# Patient Record
Sex: Female | Born: 1990
Health system: Southern US, Community
[De-identification: ages and names within clinical notes are randomized; demographics above are authoritative.]

## PROBLEM LIST (undated history)

## (undated) ENCOUNTER — Inpatient Hospital Stay (HOSPITAL_COMMUNITY): Payer: Self-pay

## (undated) DIAGNOSIS — Z3400 Encounter for supervision of normal first pregnancy, unspecified trimester: Secondary | ICD-10-CM

## (undated) DIAGNOSIS — D649 Anemia, unspecified: Secondary | ICD-10-CM

## (undated) DIAGNOSIS — R7303 Prediabetes: Secondary | ICD-10-CM

## (undated) DIAGNOSIS — Z789 Other specified health status: Secondary | ICD-10-CM

## (undated) HISTORY — PX: NO PAST SURGERIES: SHX2092

## (undated) HISTORY — DX: Other specified health status: Z78.9

---
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
## 2010-12-15 ENCOUNTER — Emergency Department (HOSPITAL_COMMUNITY)
Admission: EM | Admit: 2010-12-15 | Discharge: 2010-12-15 | Payer: Self-pay | Source: Home / Self Care | Admitting: Emergency Medicine

## 2012-11-24 ENCOUNTER — Encounter (HOSPITAL_COMMUNITY): Payer: Self-pay

## 2012-11-24 ENCOUNTER — Inpatient Hospital Stay (HOSPITAL_COMMUNITY)
Admission: AD | Admit: 2012-11-24 | Discharge: 2012-11-24 | Disposition: A | Discharge status: Home / Self Care | Payer: Medicaid Other | Source: Ambulatory Visit | Attending: Obstetrics & Gynecology | Admitting: Obstetrics & Gynecology

## 2012-11-24 DIAGNOSIS — Z3490 Encounter for supervision of normal pregnancy, unspecified, unspecified trimester: Secondary | ICD-10-CM

## 2012-11-24 DIAGNOSIS — Z3201 Encounter for pregnancy test, result positive: Secondary | ICD-10-CM | POA: Insufficient documentation

## 2012-11-24 LAB — POCT PREGNANCY, URINE: Preg Test, Ur: POSITIVE — AB

## 2012-11-24 NOTE — MAU Note (Signed)
Had positive pregnancy and wanted to confirm pregnancy. No pain or bleeding.

## 2012-11-24 NOTE — MAU Provider Note (Signed)
  History     CSN: [id number]  Arrival date and time: 11/24/12 2126   None     Chief Complaint  Patient presents with  . Possible Pregnancy   HPI This is a 21 y.o. female who presents for verification of a positive pregnancy test she did at home. Very worried about telling her mother.   RN Note: Had positive pregnancy and wanted to confirm pregnancy. No pain or bleeding.   OB History    Grav Para Term Preterm Abortions TAB SAB Ect Mult Living   1               No past medical history on file.  No past surgical history on file.  No family history on file.  History  Substance Use Topics  . Smoking status: Not on file  . Smokeless tobacco: Not on file  . Alcohol Use: Not on file    Allergies: Allergies not on file  No prescriptions prior to admission    ROS No complaints  Physical Exam   Blood pressure 141/85, pulse 105, temperature 99 F (37.2 C), temperature source Oral, resp. rate 18, height 5\' 6"  (1.676 m), weight 141 lb 3.2 oz (64.048 kg), last menstrual period 10/08/2012.  Physical Exam  Constitutional: She is oriented to person, place, and time. She appears well-developed and well-nourished. No distress.  Cardiovascular: Normal rate.   Respiratory: Effort normal.  Neurological: She is alert and oriented to person, place, and time.  Skin: Skin is warm and dry.  Psychiatric: She has a normal mood and affect.  Pelvic exam deferred secondary to no complaints   MAU Course  Procedures  Assessment and Plan  A:  Pregnancy at [redacted]w[redacted]d        P:  Discharge home      Proof of pregnancy letter      Discussed communication issues      F/U with prenatal care  [facility] 11/24/2012, 10:25 PM

## 2012-12-04 NOTE — L&D Delivery Note (Signed)
Delivery Note At 7:53 PM a viable and healthy female was delivered via Vaginal, Spontaneous Delivery (Presentation: ; Occiput Anterior, to LOT).  APGAR: 5, ; weight 8 lb 11.9 oz (3966 g).   Placenta status: Intact, Spontaneous.  Cord: 3 vessels with the following complications: Nuchal.    Anesthesia: Epidural  Episiotomy: None Lacerations: Perineal;1st degree;Vaginal Suture Repair: 3.0 vicryl rapide Est. Blood Loss (mL): 400cc  Mom to postpartum.  Baby to stay with Mom.  BOVARD,Carmyn Hamm 07/16/2013, 8:26 PM  O+/RI/Contra POPs/Br  Pt desires circ for infant - including r/b/a

## 2013-01-02 ENCOUNTER — Ambulatory Visit (INDEPENDENT_AMBULATORY_CARE_PROVIDER_SITE_OTHER): Payer: Medicaid Other | Admitting: Obstetrics & Gynecology

## 2013-01-02 ENCOUNTER — Other Ambulatory Visit (HOSPITAL_COMMUNITY)
Admission: RE | Admit: 2013-01-02 | Discharge: 2013-01-02 | Disposition: A | Discharge status: Home / Self Care | Payer: Medicaid Other | Source: Ambulatory Visit | Attending: Obstetrics & Gynecology | Admitting: Obstetrics & Gynecology

## 2013-01-02 ENCOUNTER — Encounter: Payer: Self-pay | Admitting: Obstetrics & Gynecology

## 2013-01-02 VITALS — BP 110/70 | Temp 98.8°F | Ht 64.75 in | Wt 139.7 lb

## 2013-01-02 DIAGNOSIS — Z01419 Encounter for gynecological examination (general) (routine) without abnormal findings: Secondary | ICD-10-CM | POA: Insufficient documentation

## 2013-01-02 DIAGNOSIS — Z3480 Encounter for supervision of other normal pregnancy, unspecified trimester: Secondary | ICD-10-CM

## 2013-01-02 DIAGNOSIS — Z1389 Encounter for screening for other disorder: Secondary | ICD-10-CM

## 2013-01-02 DIAGNOSIS — Z113 Encounter for screening for infections with a predominantly sexual mode of transmission: Secondary | ICD-10-CM | POA: Insufficient documentation

## 2013-01-02 DIAGNOSIS — Z3687 Encounter for antenatal screening for uncertain dates: Secondary | ICD-10-CM

## 2013-01-02 DIAGNOSIS — Z3490 Encounter for supervision of normal pregnancy, unspecified, unspecified trimester: Secondary | ICD-10-CM | POA: Insufficient documentation

## 2013-01-02 LAB — POCT URINALYSIS DIP (DEVICE)
Bilirubin Urine: NEGATIVE
Glucose, UA: NEGATIVE mg/dL
Hgb urine dipstick: NEGATIVE
Nitrite: NEGATIVE
Urobilinogen, UA: 0.2 mg/dL (ref 0.0–1.0)
pH: 7 (ref 5.0–8.0)

## 2013-01-02 LAB — HIV ANTIBODY (ROUTINE TESTING W REFLEX): HIV: NONREACTIVE

## 2013-01-02 NOTE — Progress Notes (Signed)
   Subjective:    Terry Kennedy is a G1P0 [redacted]w[redacted]d being seen today for her first obstetrical visit.  Her obstetrical history is significant for none. Patient does intend to breast feed. Pregnancy history fully reviewed.  Patient reports fatigue and headache.  Headaches are 2-3x weeks.  Frontal.  Non vomiting. No photophobia.  Pt has not taken any meds.  Will start with Tylenol.  Relaxation techniques discussed.  Filed Vitals:   01/02/13 0828  BP: 110/70  Temp: 98.8 F (37.1 C)  Weight: 139 lb 11.2 oz (63.368 kg)    HISTORY: OB History    Grav Para Term Preterm Abortions TAB SAB Ect Mult Living   1              # Outc Date GA Lbr Len/2nd Wgt Sex Del Anes PTL Lv   1 CUR              Past Medical History  Diagnosis Date  . No pertinent past medical history    Past Surgical History  Procedure Date  . No past surgeries    History reviewed. No pertinent family history.   Exam    Uterus:     Pelvic Exam:    Perineum: No Hemorrhoids   Vulva: normal   Vagina:  normal mucosa, normal discharge   pH: n/a   Cervix: no bleeding following Pap   Adnexa: normal adnexa   Bony Pelvis: average  System: Breast:  pt refused   Skin: normal coloration and turgor, no rashes    Neurologic: oriented, normal mood   Extremities: no erythema, induration, or nodules   HEENT oropharynx clear, no lesions   Mouth/Teeth mucous membranes moist, pharynx normal without lesions   Neck supple and no masses   Cardiovascular: regular rate and rhythm   Respiratory:  chest clear, no wheezing, crepitations, rhonchi, normal symmetric air entry   Abdomen: normal findings: uterus about 13 weeks   Urinary: urethral meatus normal      Assessment:    Pregnancy: G1P0 Patient Active Problem List  Diagnosis  . Unsure of last menstrual period, need [place] for dates        Plan:     Initial labs drawn. Prenatal vitamins. Problem list reviewed and updated. Genetic Screening discussed First Screen:  ordered.  Ultrasound discussed; fetal survey: requested.  Follow up in 4 weeks. 50% of 30 min visit spent on counseling and coordination of care.  Tylenol for headaches   Terry Kennedy H. 01/02/2013

## 2013-01-02 NOTE — Addendum Note (Signed)
Addended by: Soyla Murphy T on: 01/02/2013 12:09 PM   Modules accepted: Orders

## 2013-01-02 NOTE — Progress Notes (Signed)
Nutrition note: 1st visit consult Pt has gained 9.7# @ [redacted]w[redacted]d, which is > expected. Pt reports eating 3 meals & 2-3 snacks/d. Pt reports drinking water & juice daily and milk most days. Pt reports taking a Women's One-A-Day multivitamin. Pt reports no N&V or heartburn. NKFA Pt received verbal & written education on general nutrition during pregnancy & Mercury in Fish handout. Encouraged pt to start taking a PNV or 2 chewable multivitamins to ensure adequate intake of vitamins (her current multivit is low in folic acid & vit A).  Disc wt gain goals of 25-35# or 1#/wk.  Pt agrees to start taking PNV or equivalent. Pt has WIC & plans to BF. F/u if referred Terry Reveal, MS, RD, LDN

## 2013-01-02 NOTE — Progress Notes (Signed)
First Screen and dating U/S scheduled 01/07/13 at 415pm in [facility].

## 2013-01-02 NOTE — Addendum Note (Signed)
Addended by: Franchot Mimes on: 01/02/2013 02:45 PM   Modules accepted: Orders

## 2013-01-02 NOTE — Patient Instructions (Signed)
Pregnancy - First Trimester During sexual intercourse, millions of sperm go into the vagina. Only 1 sperm will penetrate and fertilize the female egg while it is in the Fallopian tube. One week later, the fertilized egg implants into the wall of the uterus. An embryo begins to develop into a baby. At 6 to 8 weeks, the eyes and face are formed and the heartbeat can be seen on ultrasound. At the end of 12 weeks (first trimester), all the baby's organs are formed. Now that you are pregnant, you will want to do everything you can to have a healthy baby. Two of the most important things are to get good prenatal care and follow your caregiver's instructions. Prenatal care is all the medical care you receive before the baby's birth. It is given to prevent, find, and treat problems during the pregnancy and childbirth. PRENATAL EXAMS  During prenatal visits, your weight, blood pressure and urine are checked. This is done to make sure you are healthy and progressing normally during the pregnancy.  A pregnant woman should gain 25 to 35 pounds during the pregnancy. However, if you are over weight or underweight, your caregiver will advise you regarding your weight.  Your caregiver will ask and answer questions for you.  Blood work, cervical cultures, other necessary tests and a Pap test are done during your prenatal exams. These tests are done to check on your health and the probable health of your baby. Tests are strongly recommended and done for HIV with your permission. This is the virus that causes AIDS. These tests are done because medications can be given to help prevent your baby from being born with this infection should you have been infected without knowing it. Blood work is also used to find out your blood type, previous infections and follow your blood levels (hemoglobin).  Low hemoglobin (anemia) is common during pregnancy. Iron and vitamins are given to help prevent this. Later in the pregnancy, blood  tests for diabetes will be done along with any other tests if any problems develop. You may need tests to make sure you and the baby are doing well.  You may need other tests to make sure you and the baby are doing well. CHANGES DURING THE FIRST TRIMESTER (THE FIRST 3 MONTHS OF PREGNANCY) Your body goes through many changes during pregnancy. They vary from person to person. Talk to your caregiver about changes you notice and are concerned about. Changes can include:  Your menstrual period stops.  The egg and sperm carry the genes that determine what you look like. Genes from you and your partner are forming a baby. The female genes determine whether the baby is a boy or a girl.  Your body increases in girth and you may feel bloated.  Feeling sick to your stomach (nauseous) and throwing up (vomiting). If the vomiting is uncontrollable, call your caregiver.  Your breasts will begin to enlarge and become tender.  Your nipples may stick out more and become darker.  The need to urinate more. Painful urination may mean you have a bladder infection.  Tiring easily.  Loss of appetite.  Cravings for certain kinds of food.  At first, you may gain or lose a couple of pounds.  You may have changes in your emotions from day to day (excited to be pregnant or concerned something may go wrong with the pregnancy and baby).  You may have more vivid and strange dreams. HOME CARE INSTRUCTIONS   It is very important   to avoid all smoking, alcohol and un-prescribed drugs during your pregnancy. These affect the formation and growth of the baby. Avoid chemicals while pregnant to ensure the delivery of a healthy infant.  Start your prenatal visits by the 12th week of pregnancy. They are usually scheduled monthly at first, then more often in the last 2 months before delivery. Keep your caregiver's appointments. Follow your caregiver's instructions regarding medication use, blood and lab tests, exercise, and  diet.  During pregnancy, you are providing food for you and your baby. Eat regular, well-balanced meals. Choose foods such as meat, fish, milk and other low fat dairy products, vegetables, fruits, and whole-grain breads and cereals. Your caregiver will tell you of the ideal weight gain.  You can help morning sickness by keeping soda crackers at the bedside. Eat a couple before arising in the morning. You may want to use the crackers without salt on them.  Eating 4 to 5 small meals rather than 3 large meals a day also may help the nausea and vomiting.  Drinking liquids between meals instead of during meals also seems to help nausea and vomiting.  A physical sexual relationship may be continued throughout pregnancy if there are no other problems. Problems may be early (premature) leaking of amniotic fluid from the membranes, vaginal bleeding, or belly (abdominal) pain.  Exercise regularly if there are no restrictions. Check with your caregiver or physical therapist if you are unsure of the safety of some of your exercises. Greater weight gain will occur in the last 2 trimesters of pregnancy. Exercising will help:  Control your weight.  Keep you in shape.  Prepare you for labor and delivery.  Help you lose your pregnancy weight after you deliver your baby.  Wear a good support or jogging bra for breast tenderness during pregnancy. This may help if worn during sleep too.  Ask when prenatal classes are available. Begin classes when they are offered.  Do not use hot tubs, steam rooms or saunas.  Wear your seat belt when driving. This protects you and your baby if you are in an accident.  Avoid raw meat, uncooked cheese, cat litter boxes and soil used by cats throughout the pregnancy. These carry germs that can cause birth defects in the baby.  The first trimester is a good time to visit your dentist for your dental health. Getting your teeth cleaned is OK. Use a softer toothbrush and brush  gently during pregnancy.  Ask for help if you have financial, counseling or nutritional needs during pregnancy. Your caregiver will be able to offer counseling for these needs as well as refer you for other special needs.  Do not take any medications or herbs unless told by your caregiver.  Inform your caregiver if there is any mental or physical domestic violence.  Make a list of emergency phone numbers of family, friends, hospital, and police and fire departments.  Write down your questions. Take them to your prenatal visit.  Do not douche.  Do not cross your legs.  If you have to stand for long periods of time, rotate you feet or take small steps in a circle.  You may have more vaginal secretions that may require a sanitary pad. Do not use tampons or scented sanitary pads. MEDICATIONS AND DRUG USE IN PREGNANCY  Take prenatal vitamins as directed. The vitamin should contain 1 milligram of folic acid. Keep all vitamins out of reach of children. Only a couple vitamins or tablets containing iron may be   fatal to a baby or young child when ingested.  Avoid use of all medications, including herbs, over-the-counter medications, not prescribed or suggested by your caregiver. Only take over-the-counter or prescription medicines for pain, discomfort, or fever as directed by your caregiver. Do not use aspirin, ibuprofen, or naproxen unless directed by your caregiver.  Let your caregiver also know about herbs you may be using.  Alcohol is related to a number of birth defects. This includes fetal alcohol syndrome. All alcohol, in any form, should be avoided completely. Smoking will cause low birth rate and premature babies.  Street or illegal drugs are very harmful to the baby. They are absolutely forbidden. A baby born to an addicted mother will be addicted at birth. The baby will go through the same withdrawal an adult does.  Let your caregiver know about any medications that you have to take  and for what reason you take them. MISCARRIAGE IS COMMON DURING PREGNANCY A miscarriage does not mean you did something wrong. It is not a reason to worry about getting pregnant again. Your caregiver will help you with questions you may have. If you have a miscarriage, you may need minor surgery. SEEK MEDICAL CARE IF:  You have any concerns or worries during your pregnancy. It is better to call with your questions if you feel they cannot wait, rather than worry about them. SEEK IMMEDIATE MEDICAL CARE IF:   An unexplained oral temperature above 102 F (38.9 C) develops, or as your caregiver suggests.  You have leaking of fluid from the vagina (birth canal). If leaking membranes are suspected, take your temperature and inform your caregiver of this when you call.  There is vaginal spotting or bleeding. Notify your caregiver of the amount and how many pads are used.  You develop a bad smelling vaginal discharge with a change in the color.  You continue to feel sick to your stomach (nauseated) and have no relief from remedies suggested. You vomit blood or coffee ground-like materials.  You lose more than 2 pounds of weight in 1 week.  You gain more than 2 pounds of weight in 1 week and you notice swelling of your face, hands, feet, or legs.  You gain 5 pounds or more in 1 week (even if you do not have swelling of your hands, face, legs, or feet).  You get exposed to [place] measles and have never had them.  You are exposed to fifth disease or chickenpox.  You develop belly (abdominal) pain. Round ligament discomfort is a common non-cancerous (benign) cause of abdominal pain in pregnancy. Your caregiver still must evaluate this.  You develop headache, fever, diarrhea, pain with urination, or shortness of breath.  You fall or are in a car accident or have any kind of trauma.  There is mental or physical violence in your home. Document Released: 11/14/2001 Document Revised: 02/12/2012  Document Reviewed: 05/18/2009 [employer] Patient Information 2013 [employer].  

## 2013-01-02 NOTE — Progress Notes (Signed)
Pulse: 96 Needs to see nutrition and social work. Needs new ob labs. Undecided on flu shot.  No 100% sure of lmp, states that she knows it was in November.

## 2013-01-02 NOTE — Addendum Note (Signed)
Addended by: Franchot Mimes on: 01/02/2013 02:40 PM   Modules accepted: Orders

## 2013-01-03 LAB — OBSTETRIC PANEL
Eosinophils Relative: 1 % (ref 0–5)
HCT: 35.3 % — ABNORMAL LOW (ref 36.0–46.0)
Hemoglobin: 12.4 g/dL (ref 12.0–15.0)
Hepatitis B Surface Ag: NEGATIVE
Lymphocytes Relative: 29 % (ref 12–46)
MCH: 30.5 pg (ref 26.0–34.0)
MCHC: 35.1 g/dL (ref 30.0–36.0)
MCV: 86.9 fL (ref 78.0–100.0)
Neutrophils Relative %: 64 % (ref 43–77)
Platelets: 212 10*3/uL (ref 150–400)
RDW: 14 % (ref 11.5–15.5)
Rubella: 1.53 Index — ABNORMAL HIGH (ref <0.90)

## 2013-01-06 LAB — HEMOGLOBINOPATHY EVALUATION
Hemoglobin Other: 0 %
Hgb A2 Quant: 2.8 % (ref 2.2–3.2)
Hgb F Quant: 0.3 % (ref 0.0–2.0)
Hgb S Quant: 0 %

## 2013-01-07 ENCOUNTER — Encounter: Payer: Self-pay | Admitting: Obstetrics & Gynecology

## 2013-01-07 ENCOUNTER — Ambulatory Visit (HOSPITAL_COMMUNITY)
Admission: RE | Admit: 2013-01-07 | Discharge: 2013-01-07 | Disposition: A | Discharge status: Home / Self Care | Payer: Medicaid Other | Source: Ambulatory Visit | Attending: Obstetrics & Gynecology | Admitting: Obstetrics & Gynecology

## 2013-01-07 ENCOUNTER — Other Ambulatory Visit: Payer: Self-pay | Admitting: Obstetrics & Gynecology

## 2013-01-07 DIAGNOSIS — Z3689 Encounter for other specified antenatal screening: Secondary | ICD-10-CM | POA: Insufficient documentation

## 2013-01-07 DIAGNOSIS — Z3490 Encounter for supervision of normal pregnancy, unspecified, unspecified trimester: Secondary | ICD-10-CM

## 2013-01-07 DIAGNOSIS — Z3687 Encounter for antenatal screening for uncertain dates: Secondary | ICD-10-CM

## 2013-01-07 NOTE — Progress Notes (Signed)
Terry Kennedy  was seen today for an ultrasound appointment.  See full report in [facility].  Impression: Single IUP at 13 0/7 weeks NT of 1.2 mm noted.  Nasal bone visualized. First trimester aneuploidy screen performed as noted above.    Recommendations: Please do not draw triple/quad screen, though patient should be offered MSAFP for neural tube defect screening.  Recommend ultrasound for fetal anatomy at approximately [redacted] weeks gestation.  Alpha Gula, MD

## 2013-01-30 ENCOUNTER — Ambulatory Visit (INDEPENDENT_AMBULATORY_CARE_PROVIDER_SITE_OTHER): Payer: Medicaid Other | Admitting: Family

## 2013-01-30 VITALS — BP 109/66 | Temp 97.0°F | Wt 145.7 lb

## 2013-01-30 DIAGNOSIS — Z3491 Encounter for supervision of normal pregnancy, unspecified, first trimester: Secondary | ICD-10-CM

## 2013-01-30 DIAGNOSIS — Z3480 Encounter for supervision of other normal pregnancy, unspecified trimester: Secondary | ICD-10-CM

## 2013-01-30 DIAGNOSIS — Z3687 Encounter for antenatal screening for uncertain dates: Secondary | ICD-10-CM

## 2013-01-30 DIAGNOSIS — Z1389 Encounter for screening for other disorder: Secondary | ICD-10-CM

## 2013-01-30 LAB — POCT URINALYSIS DIP (DEVICE)
Bilirubin Urine: NEGATIVE
Glucose, UA: NEGATIVE mg/dL
Nitrite: NEGATIVE
Specific Gravity, Urine: 1.02 (ref 1.005–1.030)
pH: 7.5 (ref 5.0–8.0)

## 2013-01-30 NOTE — Progress Notes (Signed)
Pulse: 85

## 2013-01-30 NOTE — Progress Notes (Signed)
U/S scheduled 02/13/13 at 2 pm.

## 2013-01-30 NOTE — Progress Notes (Signed)
Reviewed lab results; schedule anatomy ultrasound in two weeks; next appt in two weeks for AFP.

## 2013-02-13 ENCOUNTER — Ambulatory Visit (INDEPENDENT_AMBULATORY_CARE_PROVIDER_SITE_OTHER): Payer: Medicaid Other | Admitting: Advanced Practice Midwife

## 2013-02-13 ENCOUNTER — Encounter: Payer: Self-pay | Admitting: Advanced Practice Midwife

## 2013-02-13 ENCOUNTER — Ambulatory Visit (HOSPITAL_COMMUNITY)
Admission: RE | Admit: 2013-02-13 | Discharge: 2013-02-13 | Disposition: A | Discharge status: Home / Self Care | Payer: Medicaid Other | Source: Ambulatory Visit | Attending: Family | Admitting: Family

## 2013-02-13 ENCOUNTER — Encounter: Payer: Self-pay | Admitting: Obstetrics & Gynecology

## 2013-02-13 ENCOUNTER — Other Ambulatory Visit: Payer: Self-pay | Admitting: Obstetrics & Gynecology

## 2013-02-13 VITALS — BP 108/69 | Temp 97.7°F | Wt 145.5 lb

## 2013-02-13 DIAGNOSIS — Z1389 Encounter for screening for other disorder: Secondary | ICD-10-CM | POA: Insufficient documentation

## 2013-02-13 DIAGNOSIS — Z363 Encounter for antenatal screening for malformations: Secondary | ICD-10-CM | POA: Insufficient documentation

## 2013-02-13 DIAGNOSIS — Z3491 Encounter for supervision of normal pregnancy, unspecified, first trimester: Secondary | ICD-10-CM

## 2013-02-13 DIAGNOSIS — Z3687 Encounter for antenatal screening for uncertain dates: Secondary | ICD-10-CM

## 2013-02-13 DIAGNOSIS — O358XX0 Maternal care for other (suspected) fetal abnormality and damage, not applicable or unspecified: Secondary | ICD-10-CM | POA: Insufficient documentation

## 2013-02-13 LAB — POCT URINALYSIS DIP (DEVICE)
Glucose, UA: NEGATIVE mg/dL
Hgb urine dipstick: NEGATIVE
Ketones, ur: NEGATIVE mg/dL
Protein, ur: NEGATIVE mg/dL
Specific Gravity, Urine: 1.02 (ref 1.005–1.030)
Urobilinogen, UA: 0.2 mg/dL (ref 0.0–1.0)
pH: 7.5 (ref 5.0–8.0)

## 2013-02-13 NOTE — Patient Instructions (Signed)
Pregnancy - Second Trimester The second trimester is the period between 13 to 27 weeks of your pregnancy. It is important to follow your doctor's instructions. HOME CARE   Do not smoke.  Do not drink alcohol or use drugs.  Only take medicine as told by your doctor.  Take prenatal vitamins as told. The vitamin should contain 1 milligram of folic acid.  Exercise.  Eat healthy foods. Eat regular, well-balanced meals.  You can have sex (intercourse) if there are no other problems with the pregnancy.  Do not use hot tubs, steam rooms, or saunas.  Wear a seat belt while driving.  Avoid raw meat, uncooked cheese, and litter boxes and soil used by cats.  Visit your dentist. Shirlee [place]ick are okay. GET HELP RIGHT AWAY IF:   You have a temperature by mouth above 102 F (38.9 C), not controlled by medicine.  Fluid is coming from your vagina.  Blood is coming from your vagina. Light spotting is common, especially after sex (intercourse).  You have a bad smelling fluid (discharge) coming from the vagina. The fluid changes from clear to white.  You still feel sick to your stomach (nauseous).  You throw up (vomit) blood.  You lose or gain more than 2 pounds (0.9 kilograms) of weight in a week, or as suggested by your doctor.  Your face, hands, feet, or legs get puffy (swell).  You get exposed to [place] measles and have never had them.  You get exposed to fifth disease or chickenpox.  You have belly (abdominal) pain.  You have a bad headache that will not go away.  You have watery poop (diarrhea), pain when you pee (urinate), or have shortness of breath.  You start to have problems seeing (blurry or double vision).  You fall, are in a car accident, or have any kind of trauma.  There is mental or physical violence at home.  You have any concerns or worries during your pregnancy. MAKE SURE YOU:   Understand these instructions.  Will watch your condition.  Will get help  right away if you are not doing well or get worse. Document Released: 02/14/2010 Document Revised: 02/12/2012 Document Reviewed: 02/14/2010 [facility] Patient Information 2013 [place], [place].

## 2013-02-13 NOTE — Progress Notes (Signed)
Well, no c/o. AFP today. Anatomy u/s this afternoon. Rev'd precautions, f/u in 4 weeks.

## 2013-02-13 NOTE — Progress Notes (Signed)
Pulse- 88 Needs a note indicating the need to allow her to wear more comfortable shoes at work

## 2013-02-15 ENCOUNTER — Encounter: Payer: Self-pay | Admitting: Family

## 2013-02-26 ENCOUNTER — Encounter: Payer: Self-pay | Admitting: *Deleted

## 2013-03-13 ENCOUNTER — Ambulatory Visit (INDEPENDENT_AMBULATORY_CARE_PROVIDER_SITE_OTHER): Payer: Medicaid Other | Admitting: Obstetrics & Gynecology

## 2013-03-13 VITALS — BP 105/65 | Temp 97.6°F | Wt 152.4 lb

## 2013-03-13 DIAGNOSIS — Z3480 Encounter for supervision of other normal pregnancy, unspecified trimester: Secondary | ICD-10-CM

## 2013-03-13 DIAGNOSIS — Z3492 Encounter for supervision of normal pregnancy, unspecified, second trimester: Secondary | ICD-10-CM

## 2013-03-13 LAB — POCT URINALYSIS DIP (DEVICE)
Bilirubin Urine: NEGATIVE
Hgb urine dipstick: NEGATIVE
Nitrite: NEGATIVE
Protein, ur: NEGATIVE mg/dL
Specific Gravity, Urine: 1.015 (ref 1.005–1.030)

## 2013-03-13 NOTE — Patient Instructions (Signed)
Return to clinic for any obstetric concerns or go to MAU for evaluation  

## 2013-03-13 NOTE — Progress Notes (Signed)
Normal anatomy. No other complaints or concerns.  Obstetric precautions reviewed.

## 2013-03-13 NOTE — Progress Notes (Signed)
Pulse: 86

## 2013-04-10 ENCOUNTER — Encounter: Payer: Medicaid Other | Admitting: Obstetrics and Gynecology

## 2013-06-17 LAB — OB RESULTS CONSOLE GC/CHLAMYDIA: Gonorrhea: NEGATIVE

## 2013-06-19 LAB — OB RESULTS CONSOLE GBS: GBS: NEGATIVE

## 2013-07-15 ENCOUNTER — Encounter (HOSPITAL_COMMUNITY): Payer: Self-pay

## 2013-07-15 ENCOUNTER — Inpatient Hospital Stay (HOSPITAL_COMMUNITY)
Admission: RE | Admit: 2013-07-15 | Discharge: 2013-07-18 | DRG: 775 | Disposition: A | Discharge status: Home / Self Care | Payer: Medicaid Other | Source: Ambulatory Visit | Attending: Obstetrics and Gynecology | Admitting: Obstetrics and Gynecology

## 2013-07-15 ENCOUNTER — Inpatient Hospital Stay (HOSPITAL_COMMUNITY)
Admission: AD | Admit: 2013-07-15 | Payer: Medicaid Other | Source: Ambulatory Visit | Admitting: Obstetrics and Gynecology

## 2013-07-15 DIAGNOSIS — Z3400 Encounter for supervision of normal first pregnancy, unspecified trimester: Secondary | ICD-10-CM

## 2013-07-15 DIAGNOSIS — O480 Post-term pregnancy: Principal | ICD-10-CM | POA: Diagnosis present

## 2013-07-15 HISTORY — DX: Encounter for supervision of normal first pregnancy, unspecified trimester: Z34.00

## 2013-07-15 LAB — CBC
HCT: 34 % — ABNORMAL LOW (ref 36.0–46.0)
Hemoglobin: 11.8 g/dL — ABNORMAL LOW (ref 12.0–15.0)
MCH: 30.5 pg (ref 26.0–34.0)
MCHC: 34.7 g/dL (ref 30.0–36.0)
MCV: 87.9 fL (ref 78.0–100.0)
RBC: 3.87 MIL/uL (ref 3.87–5.11)

## 2013-07-15 MED ORDER — BUTORPHANOL TARTRATE 1 MG/ML IJ SOLN
2.0000 mg | INTRAMUSCULAR | Status: DC | PRN
  Filled 2013-07-15: qty 1

## 2013-07-15 MED ORDER — IBUPROFEN 600 MG PO TABS: 600.0000 mg | ORAL_TABLET | Freq: Four times a day (QID) | ORAL | Status: DC | PRN

## 2013-07-15 MED ORDER — OXYTOCIN 40 UNITS IN LACTATED RINGERS INFUSION - SIMPLE MED: 62.5000 mL/h | INTRAVENOUS | Status: DC

## 2013-07-15 MED ORDER — OXYTOCIN BOLUS FROM INFUSION: 500.0000 mL | INTRAVENOUS | Status: DC

## 2013-07-15 MED ORDER — LACTATED RINGERS IV SOLN
INTRAVENOUS | Status: DC
  Administered 2013-07-15 – 2013-07-16 (×2): via INTRAVENOUS
  Administered 2013-07-16: 125 mL/h via INTRAVENOUS

## 2013-07-15 MED ORDER — ZOLPIDEM TARTRATE 5 MG PO TABS
5.0000 mg | ORAL_TABLET | Freq: Every evening | ORAL | Status: DC | PRN
  Filled 2013-07-15: qty 1

## 2013-07-15 MED ORDER — ONDANSETRON HCL 4 MG/2ML IJ SOLN: 4.0000 mg | Freq: Four times a day (QID) | INTRAMUSCULAR | Status: DC | PRN

## 2013-07-15 MED ORDER — ACETAMINOPHEN 325 MG PO TABS: 650.0000 mg | ORAL_TABLET | ORAL | Status: DC | PRN

## 2013-07-15 MED ORDER — LIDOCAINE HCL (PF) 1 % IJ SOLN
30.0000 mL | INTRAMUSCULAR | Status: DC | PRN
  Filled 2013-07-15: qty 30

## 2013-07-15 MED ORDER — CITRIC ACID-SODIUM CITRATE 334-500 MG/5ML PO SOLN: 30.0000 mL | ORAL | Status: DC | PRN

## 2013-07-15 MED ORDER — TERBUTALINE SULFATE 1 MG/ML IJ SOLN: 0.2500 mg | Freq: Once | INTRAMUSCULAR | Status: AC | PRN

## 2013-07-15 MED ORDER — LACTATED RINGERS IV SOLN
500.0000 mL | INTRAVENOUS | Status: DC | PRN
  Administered 2013-07-16: 500 mL via INTRAVENOUS

## 2013-07-15 MED ORDER — MISOPROSTOL 25 MCG QUARTER TABLET
25.0000 ug | ORAL_TABLET | ORAL | Status: DC | PRN
  Administered 2013-07-15: 25 ug via VAGINAL
  Filled 2013-07-15: qty 0.25
  Filled 2013-07-15: qty 1

## 2013-07-15 MED ORDER — OXYCODONE-ACETAMINOPHEN 5-325 MG PO TABS
1.0000 | ORAL_TABLET | ORAL | Status: DC | PRN
Start: 2013-07-15 — End: 2013-07-16

## 2013-07-15 MED ORDER — OXYTOCIN 40 UNITS IN LACTATED RINGERS INFUSION - SIMPLE MED
1.0000 m[IU]/min | INTRAVENOUS | Status: DC
  Administered 2013-07-16: 2 m[IU]/min via INTRAVENOUS
  Administered 2013-07-16: 1 m[IU]/min via INTRAVENOUS
  Filled 2013-07-15: qty 1000

## 2013-07-15 NOTE — H&P (Signed)
NATTALIE SANTIESTEBAN is a 22 y.o. female G1P0 at 6+ for iolgiven postdates status.  +FM, no LOF, no VB, occ ctx; Uncomplicated prenatal care except transfer of care from [facility] clinics at 24 weeks.  gbbs negative.   Maternal Medical History:  Contractions: Frequency: irregular.    Fetal activity: Perceived fetal activity is normal.      OB History   Grav Para Term Preterm Abortions TAB SAB Ect Mult Living   1             G1 present No abn pap, h/o Chl  Past Medical History  Diagnosis Date  . No pertinent past medical history   . Normal pregnancy, first 07/15/2013   Past Surgical History  Procedure Laterality Date  . No past surgeries     Family History: DM Social History:  reports that she has never smoked. She has never used smokeless tobacco. She reports that she does not drink alcohol or use illicit drugs.single  Meds PNV All NKDA   Prenatal Transfer Tool  Maternal Diabetes: No Genetic Screening: Declined Maternal Ultrasounds/Referrals: Normal Fetal Ultrasounds or other Referrals:  None Maternal Substance Abuse:  No Significant Maternal Medications:  None Significant Maternal Lab Results:  Lab values include: Group B Strep negative Other Comments:  None  Review of Systems  Constitutional: Negative.   HENT: Negative.   Eyes: Negative.   Respiratory: Negative.   Cardiovascular: Negative.   Gastrointestinal: Negative.   Genitourinary: Negative.   Skin: Negative.   Neurological: Negative.   Psychiatric/Behavioral: Negative.     Dilation: 1 Exam by:: Dr. Jackelyn Knife Blood pressure 121/74, pulse 97, temperature 98.6 F (37 C), temperature source Oral, resp. rate 20, height 5\' 5"  (1.651 m), weight 78.019 kg (172 lb), last menstrual period 10/08/2012. Maternal Exam:  Uterine Assessment: Contraction frequency is irregular.   Abdomen: Fundal height is appropriate for gestation.   Estimated fetal weight is 7.5-8.5#.   Fetal presentation: vertex  Introitus: Normal  vulva. Normal vagina.  Pelvis: adequate for delivery.   Cervix: Cervix evaluated by digital exam.     Physical Exam  Constitutional: She is oriented to person, place, and time. She appears well-developed and well-nourished.  HENT:  Head: Normocephalic and atraumatic.  Cardiovascular: Normal rate and regular rhythm.   Respiratory: Effort normal and breath sounds normal. No respiratory distress.  GI: Soft. Bowel sounds are normal. There is no tenderness.  Musculoskeletal: Normal range of motion.  Neurological: She is alert and oriented to person, place, and time.  Skin: Skin is warm and dry.  Psychiatric: She has a normal mood and affect. Her behavior is normal.    Prenatal labs: ABO, Rh: O/POS/-- (01/30 1446) Antibody: NEG (01/30 1446) Rubella: 1.53 (01/30 1446) RPR: NON REAC (01/30 1446)  HBsAg: NEGATIVE (01/30 1446)  HIV: NON REACTIVE (01/30 1446)  GBS:   neg  glucoa 90/ GC neg/ Chl neg/nl Hgb electro/Hgb 12.4/Plt 212K/  [place] EDC 07/15/13 nl anat, ant plac, female  Assessment/Plan: 22yo G1P0 at 69+ for IOL given term status iol by cervical ripening, then pitocin' Expect SVD Epidural and stadol prn   BOVARD,Woodard Perrell 07/15/2013, 10:09 PM

## 2013-07-15 NOTE — Progress Notes (Signed)
22yo, G1P0, EGA [redacted] weeks, admitted by Dr. Ellyn Hack for ripening and induction. Feeling some ctx Afeb, VSS FHT- Cat I, ctx q 2-4 min VE-1/30/-3, vtx, cytotec placed Will monitor progress

## 2013-07-16 ENCOUNTER — Inpatient Hospital Stay (HOSPITAL_COMMUNITY): Payer: Medicaid Other | Admitting: Anesthesiology

## 2013-07-16 ENCOUNTER — Encounter (HOSPITAL_COMMUNITY): Payer: Self-pay | Admitting: Anesthesiology

## 2013-07-16 ENCOUNTER — Encounter (HOSPITAL_COMMUNITY): Payer: Self-pay

## 2013-07-16 MED ORDER — LIDOCAINE HCL (PF) 1 % IJ SOLN
INTRAMUSCULAR | Status: DC | PRN
  Administered 2013-07-16 (×2): 4 mL

## 2013-07-16 MED ORDER — TETANUS-DIPHTH-ACELL PERTUSSIS 5-2.5-18.5 LF-MCG/0.5 IM SUSP
0.5000 mL | Freq: Once | INTRAMUSCULAR | Status: AC
  Administered 2013-07-17: 0.5 mL via INTRAMUSCULAR
  Filled 2013-07-16: qty 0.5

## 2013-07-16 MED ORDER — DIPHENHYDRAMINE HCL 25 MG PO CAPS: 25.0000 mg | ORAL_CAPSULE | Freq: Four times a day (QID) | ORAL | Status: DC | PRN

## 2013-07-16 MED ORDER — OXYCODONE-ACETAMINOPHEN 5-325 MG PO TABS: 1.0000 | ORAL_TABLET | ORAL | Status: DC | PRN

## 2013-07-16 MED ORDER — PRENATAL MULTIVITAMIN CH
1.0000 | ORAL_TABLET | Freq: Every day | ORAL | Status: DC
  Administered 2013-07-17 – 2013-07-18 (×2): 1 via ORAL
  Filled 2013-07-16 (×2): qty 1

## 2013-07-16 MED ORDER — FENTANYL 2.5 MCG/ML BUPIVACAINE 1/10 % EPIDURAL INFUSION (WH - ANES)
INTRAMUSCULAR | Status: DC | PRN
  Administered 2013-07-16: 14 mL/h via EPIDURAL

## 2013-07-16 MED ORDER — LANOLIN HYDROUS EX OINT: TOPICAL_OINTMENT | CUTANEOUS | Status: DC | PRN

## 2013-07-16 MED ORDER — PHENYLEPHRINE 40 MCG/ML (10ML) SYRINGE FOR IV PUSH (FOR BLOOD PRESSURE SUPPORT)
80.0000 ug | PREFILLED_SYRINGE | INTRAVENOUS | Status: DC | PRN
  Filled 2013-07-16: qty 2
  Filled 2013-07-16: qty 5

## 2013-07-16 MED ORDER — LACTATED RINGERS IV SOLN: INTRAVENOUS | Status: DC

## 2013-07-16 MED ORDER — SENNOSIDES-DOCUSATE SODIUM 8.6-50 MG PO TABS
2.0000 | ORAL_TABLET | Freq: Every day | ORAL | Status: DC
  Administered 2013-07-16 – 2013-07-17 (×2): 2 via ORAL

## 2013-07-16 MED ORDER — CITRIC ACID-SODIUM CITRATE 334-500 MG/5ML PO SOLN
ORAL | Status: AC
  Filled 2013-07-16: qty 15

## 2013-07-16 MED ORDER — PHENYLEPHRINE 40 MCG/ML (10ML) SYRINGE FOR IV PUSH (FOR BLOOD PRESSURE SUPPORT)
80.0000 ug | PREFILLED_SYRINGE | INTRAVENOUS | Status: DC | PRN
  Filled 2013-07-16: qty 2

## 2013-07-16 MED ORDER — ONDANSETRON HCL 4 MG PO TABS: 4.0000 mg | ORAL_TABLET | ORAL | Status: DC | PRN

## 2013-07-16 MED ORDER — LACTATED RINGERS IV SOLN: 500.0000 mL | Freq: Once | INTRAVENOUS | Status: DC

## 2013-07-16 MED ORDER — DIBUCAINE 1 % RE OINT: 1.0000 "application " | TOPICAL_OINTMENT | RECTAL | Status: DC | PRN

## 2013-07-16 MED ORDER — IBUPROFEN 600 MG PO TABS
600.0000 mg | ORAL_TABLET | Freq: Four times a day (QID) | ORAL | Status: DC
  Administered 2013-07-16 – 2013-07-18 (×7): 600 mg via ORAL
  Filled 2013-07-16 (×8): qty 1

## 2013-07-16 MED ORDER — ZOLPIDEM TARTRATE 5 MG PO TABS: 5.0000 mg | ORAL_TABLET | Freq: Every evening | ORAL | Status: DC | PRN

## 2013-07-16 MED ORDER — EPHEDRINE 5 MG/ML INJ
10.0000 mg | INTRAVENOUS | Status: DC | PRN
  Filled 2013-07-16: qty 2

## 2013-07-16 MED ORDER — WITCH HAZEL-GLYCERIN EX PADS
1.0000 "application " | MEDICATED_PAD | CUTANEOUS | Status: DC | PRN
  Administered 2013-07-17: 1 via TOPICAL

## 2013-07-16 MED ORDER — EPHEDRINE 5 MG/ML INJ
10.0000 mg | INTRAVENOUS | Status: DC | PRN
  Filled 2013-07-16: qty 2
  Filled 2013-07-16: qty 4

## 2013-07-16 MED ORDER — ONDANSETRON HCL 4 MG/2ML IJ SOLN: 4.0000 mg | INTRAMUSCULAR | Status: DC | PRN

## 2013-07-16 MED ORDER — DIPHENHYDRAMINE HCL 50 MG/ML IJ SOLN: 12.5000 mg | INTRAMUSCULAR | Status: DC | PRN

## 2013-07-16 MED ORDER — FENTANYL 2.5 MCG/ML BUPIVACAINE 1/10 % EPIDURAL INFUSION (WH - ANES)
14.0000 mL/h | INTRAMUSCULAR | Status: DC | PRN
  Administered 2013-07-16: 14 mL/h via EPIDURAL
  Filled 2013-07-16 (×2): qty 125

## 2013-07-16 MED ORDER — SIMETHICONE 80 MG PO CHEW: 80.0000 mg | CHEWABLE_TABLET | ORAL | Status: DC | PRN

## 2013-07-16 MED ORDER — BENZOCAINE-MENTHOL 20-0.5 % EX AERO
1.0000 "application " | INHALATION_SPRAY | CUTANEOUS | Status: DC | PRN
  Administered 2013-07-17: 1 via TOPICAL
  Filled 2013-07-16 (×2): qty 56

## 2013-07-16 NOTE — Anesthesia Preprocedure Evaluation (Signed)

## 2013-07-16 NOTE — Progress Notes (Signed)
Pt transferred to room 142 via wheelchair, newborn in mothers arms. Family at pts side.

## 2013-07-16 NOTE — Progress Notes (Signed)
Patient ID: Terry Kennedy, female   DOB: 1991/01/03, 22 y.o.   MRN: [id number]  D/w pt POC, voices understanding Comfortable with epidural  130's mod var Toco, tripling, q 1-45min  Continue IOL

## 2013-07-16 NOTE — Anesthesia Procedure Notes (Signed)
Epidural Patient location during procedure: OB Start time: 07/16/2013 7:00 AM  Staffing Anesthesiologist: Muneeb Veras A. Performed by: anesthesiologist   Preanesthetic Checklist Completed: patient identified, site marked, surgical consent, pre-op evaluation, timeout performed, IV checked, risks and benefits discussed and monitors and equipment checked  Epidural Patient position: sitting Prep: site prepped and draped and DuraPrep Patient monitoring: continuous pulse ox and blood pressure Approach: midline Injection technique: LOR air  Needle:  Needle type: Tuohy  Needle gauge: 17 G Needle length: 9 cm and 9 Needle insertion depth: 5 cm cm Catheter type: closed end flexible Catheter size: 19 Gauge Catheter at skin depth: 10 cm Test dose: negative and Other  Assessment Events: blood not aspirated, injection not painful, no injection resistance, negative IV test and no paresthesia  Additional Notes Patient identified. Risks and benefits discussed including failed block, incomplete  Pain control, post dural puncture headache, nerve damage, paralysis, blood pressure Changes, nausea, vomiting, reactions to medications-both toxic and allergic and post Partum back pain. All questions were answered. Patient expressed understanding and wished to proceed. Sterile technique was used throughout procedure. Epidural site was Dressed with sterile barrier dressing. No paresthesias, signs of intravascular injection Or signs of intrathecal spread were encountered.  Patient was more comfortable after the epidural was dosed. Please see RN's note for documentation of vital signs and FHR which are stable.

## 2013-07-16 NOTE — Progress Notes (Signed)
Dr. Ellyn Hack notified of prolonged/late variables, position changed side to side, Iv fluid bolus, pitocin stopped, SVE.

## 2013-07-16 NOTE — Progress Notes (Signed)
Patient ID: Terry Kennedy, female   DOB: 04/03/1991, 22 y.o.   MRN: [id number]  Doing well.  Comfortable with epidural.  AF VSS gen NAD  FHTs - 140's mod variability toco Q , tripling  SVE 5/80/0  IUPC placed w/o diff/comp  Continue IOL

## 2013-07-16 NOTE — Progress Notes (Signed)
Patient ID: Terry Kennedy, female   DOB: [birth year], 22 y.o.   MRN: [id number]  Comfortable with epidural  AF VSS gen NAD  FHTs 140's, mod var toco q 2-4  9/100/0-+1  Expect SVD

## 2013-07-16 NOTE — Progress Notes (Signed)
Patient ID: Terry Kennedy, female   DOB: 06/14/1991, 22 y.o.   MRN: [id number]  Called to review strip at 15:10 Pt with variables/slowing heart rate Pt with rapid cervical change and tetanic ctx. Pitocin off Recovery to 130-140, mod var Close monitoring

## 2013-07-17 ENCOUNTER — Encounter (HOSPITAL_COMMUNITY): Payer: Self-pay

## 2013-07-17 LAB — CBC
Hemoglobin: 10.5 g/dL — ABNORMAL LOW (ref 12.0–15.0)
MCH: 31 pg (ref 26.0–34.0)
MCV: 87.9 fL (ref 78.0–100.0)
RBC: 3.39 MIL/uL — ABNORMAL LOW (ref 3.87–5.11)

## 2013-07-17 NOTE — Anesthesia Postprocedure Evaluation (Signed)
  Anesthesia Post-op Note  Patient: Terry Kennedy  Procedure(s) Performed: * No procedures listed *  Patient Location: PACU and Mother/Baby  Anesthesia Type:Epidural  Level of Consciousness: awake, alert  and oriented  Airway and Oxygen Therapy: Patient Spontanous Breathing  Post-op Pain: none  Post-op Assessment: Post-op Vital signs reviewed, Patient's Cardiovascular Status Stable, No headache, No backache, No residual numbness and No residual motor weakness  Post-op Vital Signs: Reviewed and stable  Complications: No apparent anesthesia complications

## 2013-07-17 NOTE — Progress Notes (Signed)
UR chart review completed.  

## 2013-07-17 NOTE — Progress Notes (Signed)
Patient called me into room because she felt a lot of fluid come out-pt admits to voiding in the bed on her pads. Was told when assisted to bathroom earlier to always empty bladder and encouraged to empty bladder before going back to bed. Patient scared because it hurt too bad

## 2013-07-17 NOTE — Progress Notes (Signed)
Post Partum Day 1 Subjective: no complaints, up ad lib, voiding, tolerating PO and nl lochia, pain controlled  Objective: Blood pressure 108/62, pulse 85, temperature 98.3 F (36.8 C), temperature source Oral, resp. rate 16, height 5\' 5"  (1.651 m), weight 78.019 kg (172 lb), last menstrual period 10/08/2012, SpO2 100.00%, unknown if currently breastfeeding.  Physical Exam:  General: alert and no distress Lochia: appropriate Uterine Fundus: firm  Recent Labs  07/15/13 2015 07/17/13 0609  HGB 11.8* 10.5*  HCT 34.0* 29.8*    Assessment/Plan: Plan for discharge tomorrow, Breastfeeding and Lactation consult. Routine pp care.     LOS: 2 days   BOVARD,Amsi Grimley 07/17/2013, 8:18 AM

## 2013-07-18 MED ORDER — IBUPROFEN 800 MG PO TABS: 800.0000 mg | ORAL_TABLET | Freq: Three times a day (TID) | ORAL | Status: DC | PRN

## 2013-07-18 MED ORDER — OXYCODONE-ACETAMINOPHEN 5-325 MG PO TABS: 1.0000 | ORAL_TABLET | Freq: Four times a day (QID) | ORAL | Status: DC | PRN

## 2013-07-18 MED ORDER — PRENATAL MULTIVITAMIN CH: 1.0000 | ORAL_TABLET | Freq: Every day | ORAL | Status: DC

## 2013-07-18 NOTE — Progress Notes (Signed)
Post Partum Day 2 Subjective: no complaints, up ad lib, voiding, tolerating PO and nl lochia, pain controlled.    Objective: Blood pressure 107/66, pulse 81, temperature 98.7 F (37.1 C), temperature source Oral, resp. rate 18, height 5\' 5"  (1.651 m), weight 78.019 kg (172 lb), last menstrual period 10/08/2012, SpO2 100.00%, unknown if currently breastfeeding.  Physical Exam:  General: alert and no distress Lochia: appropriate Uterine Fundus: firm   Recent Labs  07/15/13 2015 07/17/13 0609  HGB 11.8* 10.5*  HCT 34.0* 29.8*    Assessment/Plan: Discharge home, Breastfeeding and Lactation consult.   Routine pp care.  D/c with motrin/percocet/pnv.  F/u 6 weeks.     LOS: 3 days   BOVARD,Massa Pe 07/18/2013, 7:43 AM

## 2013-07-18 NOTE — Lactation Note (Signed)
This note was copied from the chart of Terry Lynnox Girten. Lactation Consultation Note  Patient Name: Terry Kennedy ZOXWR'U Date: 07/18/2013   Pecola Leisure has exclusively breast fed, breast are filling and colostrum can be hand expressed. Reviewed hand expression and discussed alternate breast massage during the feeding to increase with transfer. Baby has not stooled yet. Mother asked to call with the next feeding so LC can assist if needed. Mother states baby cluster fed last night. Mother has a discharge and she will stay with baby if he is made the patient.  Maternal Data    Feeding Feeding Type: Breast Milk Length of feed: 15 min (15)  LATCH Score/Interventions                      Lactation Tools Discussed/Used     Consult Status      Christella Hartigan M 07/18/2013, 11:22 AM

## 2013-07-18 NOTE — Discharge Summary (Signed)
Obstetric Discharge Summary Reason for Admission: induction of labor Prenatal Procedures: none Intrapartum Procedures: spontaneous vaginal delivery Postpartum Procedures: none Complications-Operative and Postpartum: 2nd degree perineal laceration Hemoglobin  Date Value Range Status  07/17/2013 10.5* 12.0 - 15.0 g/dL Final     HCT  Date Value Range Status  07/17/2013 29.8* 36.0 - 46.0 % Final    Physical Exam:  General: alert and no distress Lochia: appropriate Uterine Fundus: firm  Discharge Diagnoses: Term Pregnancy-delivered  Discharge Information: Date: 07/18/2013 Activity: pelvic rest Diet: routine Medications: PNV, Ibuprofen and Percocet Condition: stable Instructions: refer to practice specific booklet Discharge to: home Follow-up Information   Follow up with BOVARD,Naksh Radi, MD. Schedule an appointment as soon as possible for a visit in 6 weeks.   Specialty:  Obstetrics and Gynecology   Contact information:   [address] [place] [address] [phone]       Newborn Data: Live born female  Birth Weight: 8 lb 11.9 oz (3966 g) APGAR: 5, 8  Home with mother.  BOVARD,Collier Bohnet 07/18/2013, 8:51 AM

## 2013-07-19 ENCOUNTER — Ambulatory Visit: Payer: Self-pay

## 2013-07-19 NOTE — Lactation Note (Signed)
This note was copied from the chart of Terry Kennedy. Lactation Consultation Note: Follow up visit with mom. She has just finished feeding the baby on the left breast for 30 minutes. Baby not interested in nursing on the right breast. Going off to sleep. Mom's breasts are getting pretty full. Reports that left breast feels softer since the baby has nursed on the side. Going to pump on right. She plans to call [facility] about a pump on Monday. Ice pack filled for mom. Baby still has not stooled since delivery. No breast feeding questions at this time. To call prn  Patient Name: Terry Kennedy ZOXWR'U Date: 07/19/2013 Reason for consult: Follow-up assessment   Maternal Data    Feeding Feeding Type: Breast Milk  LATCH Score/Interventions Latch: Too sleepy or reluctant, no latch achieved, no sucking elicited.  Audible Swallowing: None Intervention(s): Skin to skin  Type of Nipple: Everted at rest and after stimulation Intervention(s): Hand pump  Comfort (Breast/Nipple): Soft / non-tender  Problem noted: Filling Interventions  (Cracked/bleeding/bruising/blister): Hand pump;Expressed breast milk to nipple  Hold (Positioning): No assistance needed to correctly position infant at breast. Intervention(s): Breastfeeding basics reviewed  LATCH Score: 6  Lactation Tools Discussed/Used     Consult Status Consult Status: PRN    Pamelia Hoit 07/19/2013, 9:27 AM

## 2014-02-08 ENCOUNTER — Encounter: Payer: Self-pay | Admitting: Family

## 2014-10-05 ENCOUNTER — Encounter (HOSPITAL_COMMUNITY): Payer: Self-pay

## 2015-08-13 ENCOUNTER — Emergency Department (INDEPENDENT_AMBULATORY_CARE_PROVIDER_SITE_OTHER)
Admission: EM | Admit: 2015-08-13 | Discharge: 2015-08-13 | Disposition: A | Discharge status: Home / Self Care | Payer: Self-pay | Source: Home / Self Care | Attending: Family Medicine | Admitting: Family Medicine

## 2015-08-13 ENCOUNTER — Encounter (HOSPITAL_COMMUNITY): Payer: Self-pay | Admitting: Emergency Medicine

## 2015-08-13 ENCOUNTER — Other Ambulatory Visit (HOSPITAL_COMMUNITY)
Admission: RE | Admit: 2015-08-13 | Discharge: 2015-08-13 | Disposition: A | Discharge status: Home / Self Care | Payer: Medicaid Other | Source: Ambulatory Visit | Attending: Family Medicine | Admitting: Family Medicine

## 2015-08-13 DIAGNOSIS — N760 Acute vaginitis: Secondary | ICD-10-CM | POA: Insufficient documentation

## 2015-08-13 DIAGNOSIS — I889 Nonspecific lymphadenitis, unspecified: Secondary | ICD-10-CM

## 2015-08-13 DIAGNOSIS — N898 Other specified noninflammatory disorders of vagina: Secondary | ICD-10-CM

## 2015-08-13 NOTE — ED Notes (Signed)
Pt reports she feels a mass on left side of pelvic onset 1 week; denies pain Concerned b/c of family hx of fibroid tumors Denies urinary sx and having regular BM Alert... No acute distress.

## 2015-08-13 NOTE — Discharge Instructions (Signed)
The bumps in your groin area are from reactive lymph nodes. These likely became reactive from shaving in that area and the folliculitis which is infection of hair follicles. This came from probably using an older razor. Please make sure to change her razor regularly and use antibacterial soap. He may also need to stop shaving for a period of time. We will call you if the vaginal swab shows signs of infection requiring indications to cure.

## 2015-08-13 NOTE — ED Provider Notes (Signed)
CSN: [id number]     Arrival date & time 08/13/15  1345 History   First MD Initiated Contact with Patient 08/13/15 1436     Chief Complaint  Patient presents with  . Cyst   (Consider location/radiation/quality/duration/timing/severity/associated sxs/prior Treatment) HPI  Rash in pelvic region. Started 1 week ago then resolved after using antibiotic ointment. . Developed bumps  Under the skin in the area a couple days later.  Constant. Getting bigger  Sexually active. Condoms every time.   Vaginal discharge: thick and white. No vaginal irritations. Denies recent ABX use.   Last intercourse 3 wks ago.   Denies any nausea, vomiting, fevers, night sweats, skin lesions currently, chest pain, shortness breath, palpitations.     Past Medical History  Diagnosis Date  . No pertinent past medical history   . Normal pregnancy, first 07/15/2013  . SVD (spontaneous vaginal delivery) 07/16/2013   Past Surgical History  Procedure Laterality Date  . No past surgeries     Family History  Problem Relation Age of Onset  . Fibroids Other    Social History  Substance Use Topics  . Smoking status: Never Smoker   . Smokeless tobacco: Never Used  . Alcohol Use: No   OB History    Gravida Para Term Preterm AB TAB SAB Ectopic Multiple Living   Review of Systems Per HPI with all other pertinent systems negative.   Allergies  Review of patient's allergies indicates no known allergies.  Home Medications   Prior to Admission medications   Not on File   Meds Ordered and Administered this Visit  Medications - No data to display  BP 122/71 mmHg  Pulse 84  Temp(Src) 97.5 F (36.4 C) (Oral)  Resp 16  SpO2 99%  Breastfeeding? No No data found.   Physical Exam Physical Exam  Constitutional: oriented to person, place, and time. appears well-developed and well-nourished. No distress.  HENT:  Head: Normocephalic and atraumatic.  Eyes: EOMI. PERRL.  Neck: Normal  range of motion.  Cardiovascular: RRR, no m/r/g, 2+ distal pulses,  Pulmonary/Chest: Effort normal and breath sounds normal. No respiratory distress.  Abdominal: Soft. Bowel sounds are normal. NonTTP, no distension.  Musculoskeletal: Normal range of motion. Non ttp, no effusion.  Neurological: alert and oriented to person, place, and time.  Skin: Skin of the pubic region shaved, multiple palpable lymph nodes felt just underneath the surface of the skin.Marland Kitchen  Psychiatric: normal mood and affect. behavior is normal. Judgment and thought content normal.  GU: Labia normal, no acute right discharge present. ED Course  Procedures (including critical care time)  Labs Review Labs Reviewed  CYTOLOGY, (ORAL, ANAL, URETHRAL) ANCILLARY ONLY    Imaging Review No results found.   Visual Acuity Review  Right Eye Distance:   Left Eye Distance:   Bilateral Distance:    Right Eye Near:   Left Eye Near:    Bilateral Near:         MDM   1. Lymphadenitis   2. Vaginal discharge    Patient likely had foot colitis which is now resolved. This led to swell lymph nodes in the groin area. Reassurance provided. Unlikely to be related to STD. Will send wet prep for further evaluation of vaginal discharge and will treat accordingly. Difficult to tell whether she has BV or yeast but suspect one of these. Counseled patient on changing her razor more frequently, not shaving at all,  use of antibacterial soap.    Ozella Rocks, MD 08/13/15 [phone]

## 2015-08-16 LAB — CERVICOVAGINAL ANCILLARY ONLY: WET PREP (BD AFFIRM): POSITIVE — AB

## 2015-08-17 NOTE — ED Notes (Addendum)
Final lab report for STD check available for review. Positive for gardnerella. DR Randal Buba authorized Flagyl 500 mg, BID x 7 days. Called to discuss reports w patient, left message for patient to call us. Patient returned call at 7:10 pm, and at her request I have called her Rx to [facility], [address] Spoke directly w pharmacist

## 2015-08-19 NOTE — ED Notes (Addendum)
Patient called and had questions about her medications, and her physical findings on her recent visit. States shse has not yet gotten her medication. Was advised to pick up her rx from the pharmacy ASAP, and to start it , and take as directed

## 2015-10-29 ENCOUNTER — Emergency Department (HOSPITAL_COMMUNITY)
Admission: EM | Admit: 2015-10-29 | Discharge: 2015-10-29 | Discharge status: Against Medical Advice | Payer: Medicaid Other | Attending: Emergency Medicine | Admitting: Emergency Medicine

## 2015-10-29 ENCOUNTER — Emergency Department (HOSPITAL_COMMUNITY)
Admission: EM | Admit: 2015-10-29 | Discharge: 2015-10-30 | Disposition: A | Discharge status: Home / Self Care | Payer: Self-pay | Attending: Emergency Medicine | Admitting: Emergency Medicine

## 2015-10-29 ENCOUNTER — Encounter (HOSPITAL_COMMUNITY): Payer: Self-pay | Admitting: Emergency Medicine

## 2015-10-29 ENCOUNTER — Encounter (HOSPITAL_COMMUNITY): Payer: Self-pay

## 2015-10-29 DIAGNOSIS — R109 Unspecified abdominal pain: Secondary | ICD-10-CM

## 2015-10-29 DIAGNOSIS — N83202 Unspecified ovarian cyst, left side: Secondary | ICD-10-CM | POA: Insufficient documentation

## 2015-10-29 DIAGNOSIS — R1110 Vomiting, unspecified: Secondary | ICD-10-CM | POA: Insufficient documentation

## 2015-10-29 DIAGNOSIS — Z3202 Encounter for pregnancy test, result negative: Secondary | ICD-10-CM | POA: Insufficient documentation

## 2015-10-29 DIAGNOSIS — R102 Pelvic and perineal pain: Secondary | ICD-10-CM

## 2015-10-29 LAB — URINALYSIS, ROUTINE W REFLEX MICROSCOPIC
BILIRUBIN URINE: NEGATIVE
Glucose, UA: 100 mg/dL — AB
HGB URINE DIPSTICK: NEGATIVE
KETONES UR: 15 mg/dL — AB
Leukocytes, UA: NEGATIVE
NITRITE: NEGATIVE
Protein, ur: NEGATIVE mg/dL
Specific Gravity, Urine: 1.022 (ref 1.005–1.030)
pH: 7.5 (ref 5.0–8.0)

## 2015-10-29 LAB — COMPREHENSIVE METABOLIC PANEL
ALBUMIN: 4.4 g/dL (ref 3.5–5.0)
ALK PHOS: 40 U/L (ref 38–126)
ALT: 9 U/L — AB (ref 14–54)
ANION GAP: 6 (ref 5–15)
AST: 16 U/L (ref 15–41)
BILIRUBIN TOTAL: 0.4 mg/dL (ref 0.3–1.2)
BUN: 6 mg/dL (ref 6–20)
CALCIUM: 9.1 mg/dL (ref 8.9–10.3)
CO2: 24 mmol/L (ref 22–32)
CREATININE: 0.71 mg/dL (ref 0.44–1.00)
Chloride: 108 mmol/L (ref 101–111)
GFR calc Af Amer: >60 mL/min (ref >60)
GFR calc non Af Amer: >60 mL/min (ref >60)
GLUCOSE: 154 mg/dL — AB (ref 65–99)
Potassium: 3.6 mmol/L (ref 3.5–5.1)
Sodium: 138 mmol/L (ref 135–145)
TOTAL PROTEIN: 7.7 g/dL (ref 6.5–8.1)

## 2015-10-29 LAB — CBC
HCT: 37.7 % (ref 36.0–46.0)
Hemoglobin: 12.8 g/dL (ref 12.0–15.0)
MCH: 30 pg (ref 26.0–34.0)
MCHC: 34 g/dL (ref 30.0–36.0)
MCV: 88.5 fL (ref 78.0–100.0)
PLATELETS: 242 10*3/uL (ref 150–400)
RBC: 4.26 MIL/uL (ref 3.87–5.11)
RDW: 12.7 % (ref 11.5–15.5)
WBC: 8.1 10*3/uL (ref 4.0–10.5)

## 2015-10-29 LAB — LIPASE, BLOOD: Lipase: 22 U/L (ref 11–51)

## 2015-10-29 LAB — I-STAT BETA HCG BLOOD, ED (MC, WL, AP ONLY)

## 2015-10-29 NOTE — ED Notes (Signed)
Abdominal pain with n/v since 10 am.  No vaginal discharge.  No change in urination.  No fever

## 2015-10-29 NOTE — ED Notes (Signed)
Patient reports that she is having pain to her lower pelvic region. The patient reports that this has been transpiring since 9 am. The patient reports that she is also having some vomiting

## 2015-10-29 NOTE — ED Notes (Signed)
Pt not found in lobby x 2.   

## 2015-10-30 ENCOUNTER — Emergency Department (HOSPITAL_COMMUNITY): Payer: Medicaid Other

## 2015-10-30 ENCOUNTER — Emergency Department (HOSPITAL_COMMUNITY): Payer: Self-pay

## 2015-10-30 LAB — WET PREP, GENITAL
Clue Cells Wet Prep HPF POC: NONE SEEN
Sperm: NONE SEEN
TRICH WET PREP: NONE SEEN
YEAST WET PREP: NONE SEEN

## 2015-10-30 MED ORDER — KETOROLAC TROMETHAMINE 60 MG/2ML IM SOLN
60.0000 mg | Freq: Once | INTRAMUSCULAR | Status: AC
  Administered 2015-10-30: 60 mg via INTRAMUSCULAR
  Filled 2015-10-30: qty 2

## 2015-10-30 MED ORDER — TRAMADOL HCL 50 MG PO TABS: 50.0000 mg | ORAL_TABLET | Freq: Four times a day (QID) | ORAL | Status: DC | PRN

## 2015-10-30 MED ORDER — ONDANSETRON 8 MG PO TBDP
8.0000 mg | ORAL_TABLET | Freq: Once | ORAL | Status: AC
  Administered 2015-10-30: 8 mg via ORAL
  Filled 2015-10-30: qty 1

## 2015-10-30 MED ORDER — HYDROCODONE-ACETAMINOPHEN 5-325 MG PO TABS
2.0000 | ORAL_TABLET | Freq: Once | ORAL | Status: DC
  Filled 2015-10-30: qty 2

## 2015-10-30 MED ORDER — NAPROXEN 500 MG PO TABS: 500.0000 mg | ORAL_TABLET | Freq: Two times a day (BID) | ORAL | Status: DC

## 2015-10-30 MED ORDER — DICYCLOMINE HCL 10 MG/ML IM SOLN
20.0000 mg | Freq: Once | INTRAMUSCULAR | Status: AC
  Administered 2015-10-30: 20 mg via INTRAMUSCULAR
  Filled 2015-10-30: qty 2

## 2015-10-30 NOTE — ED Notes (Signed)
Pt states that she has no pain at this time and requests that her pain medication be held for now.  No acute distress.

## 2015-10-30 NOTE — Discharge Instructions (Signed)
Ovarian Cyst An ovarian cyst is a fluid-filled sac that forms on an ovary. The ovaries are small organs that produce eggs in women. Various types of cysts can form on the ovaries. Most are not cancerous. Many do not cause problems, and they often go away on their own. Some may cause symptoms and require treatment. Common types of ovarian cysts include:  Functional cysts--These cysts may occur every month during the menstrual cycle. This is normal. The cysts usually go away with the next menstrual cycle if the woman does not get pregnant. Usually, there are no symptoms with a functional cyst.  Endometrioma cysts--These cysts form from the tissue that lines the uterus. They are also called "chocolate cysts" because they become filled with blood that turns brown. This type of cyst can cause pain in the lower abdomen during intercourse and with your menstrual period.  Cystadenoma cysts--This type develops from the cells on the outside of the ovary. These cysts can get very big and cause lower abdomen pain and pain with intercourse. This type of cyst can twist on itself, cut off its blood supply, and cause severe pain. It can also easily rupture and cause a lot of pain.  Dermoid cysts--This type of cyst is sometimes found in both ovaries. These cysts may contain different kinds of body tissue, such as skin, teeth, hair, or cartilage. They usually do not cause symptoms unless they get very big.  Theca lutein cysts--These cysts occur when too much of a certain hormone (human chorionic gonadotropin) is produced and overstimulates the ovaries to produce an egg. This is most common after procedures used to assist with the conception of a baby (in vitro fertilization). CAUSES   Fertility drugs can cause a condition in which multiple large cysts are formed on the ovaries. This is called ovarian hyperstimulation syndrome.  A condition called polycystic ovary syndrome can cause hormonal imbalances that can lead to  nonfunctional ovarian cysts. SIGNS AND SYMPTOMS  Many ovarian cysts do not cause symptoms. If symptoms are present, they may include:  Pelvic pain or pressure.  Pain in the lower abdomen.  Pain during sexual intercourse.  Increasing girth (swelling) of the abdomen.  Abnormal menstrual periods.  Increasing pain with menstrual periods.  Stopping having menstrual periods without being pregnant. DIAGNOSIS  These cysts are commonly found during a routine or annual pelvic exam. Tests may be ordered to find out more about the cyst. These tests may include:  Ultrasound.  X-ray of the pelvis.  CT scan.  MRI.  Blood tests. TREATMENT  Many ovarian cysts go away on their own without treatment. Your health care provider may want to check your cyst regularly for 2-3 months to see if it changes. For women in menopause, it is particularly important to monitor a cyst closely because of the higher rate of ovarian cancer in menopausal women. When treatment is needed, it may include any of the following:  A procedure to drain the cyst (aspiration). This may be done using a long needle and ultrasound. It can also be done through a laparoscopic procedure. This involves using a thin, lighted tube with a tiny camera on the end (laparoscope) inserted through a small incision.  Surgery to remove the whole cyst. This may be done using laparoscopic surgery or an open surgery involving a larger incision in the lower abdomen.  Hormone treatment or birth control pills. These methods are sometimes used to help dissolve a cyst. HOME CARE INSTRUCTIONS   Only take over-the-counter   or prescription medicines as directed by your health care provider.  Follow up with your health care provider as directed.  Get regular pelvic exams and Pap tests. SEEK MEDICAL CARE IF:   Your periods are late, irregular, or painful, or they stop.  Your pelvic pain or abdominal pain does not go away.  Your abdomen becomes  larger or swollen.  You have pressure on your bladder or trouble emptying your bladder completely.  You have pain during sexual intercourse.  You have feelings of fullness, pressure, or discomfort in your stomach.  You lose weight for no apparent reason.  You feel generally ill.  You become constipated.  You lose your appetite.  You develop acne.  You have an increase in body and facial hair.  You are gaining weight, without changing your exercise and eating habits.  You think you are pregnant. SEEK IMMEDIATE MEDICAL CARE IF:   You have increasing abdominal pain.  You feel sick to your stomach (nauseous), and you throw up (vomit).  You develop a fever that comes on suddenly.  You have abdominal pain during a bowel movement.  Your menstrual periods become heavier than usual. MAKE SURE YOU:  Understand these instructions.  Will watch your condition.  Will get help right away if you are not doing well or get worse.   This information is not intended to replace advice given to you by your health care provider. Make sure you discuss any questions you have with your health care provider.   Document Released: 11/20/2005 Document Revised: 11/25/2013 Document Reviewed: 07/28/2013 Elsevier Interactive Patient Education 2016 [employer]  

## 2015-10-30 NOTE — ED Provider Notes (Signed)
CSN: [id number]     Arrival date & time 10/29/15  1924 History   First MD Initiated Contact with Patient 10/30/15 0053     Chief Complaint  Patient presents with  . Pelvic Pain     (Consider location/radiation/quality/duration/timing/severity/associated sxs/prior Treatment) HPI Comments: Patient is a 24 year old G1P1 female who presents to the emergency department for further evaluation of pelvic pain. Patient states that pain began at 8:30 in the morning and has been intermittent and sharp since onset. Pain is located primarily in her left lower quadrant/suprapubic abdomen. She states that "laying a certain way helps". She denies taking any medications for pain control. Patient has had nausea as well as 3 episodes of nonbloody, nonbilious emesis since her pain began. She denies any radiation of her pain and denies associated fever, vaginal bleeding, vaginal discharge, dysuria, and hematuria. Her last menstrual period was 2 weeks ago. She denies any history of abdominal surgeries.  The history is provided by the patient. No language interpreter was used.    Past Medical History  Diagnosis Date  . No pertinent past medical history   . Normal pregnancy, first 07/15/2013  . SVD (spontaneous vaginal delivery) 07/16/2013   Past Surgical History  Procedure Laterality Date  . No past surgeries     Family History  Problem Relation Age of Onset  . Fibroids Other    Social History  Substance Use Topics  . Smoking status: Never Smoker   . Smokeless tobacco: Never Used  . Alcohol Use: No   OB History    Gravida Para Term Preterm AB TAB SAB Ectopic Multiple Living   Review of Systems  Constitutional: Negative for fever.  Gastrointestinal: Positive for nausea, vomiting and abdominal pain.  Genitourinary: Positive for pelvic pain. Negative for dysuria, hematuria, vaginal bleeding and vaginal discharge.  All other systems reviewed and are negative.   Allergies   Review of patient's allergies indicates no known allergies.  Home Medications   Prior to Admission medications   Medication Sig Start Date End Date Taking? Authorizing Provider  acetaminophen (TYLENOL) 500 MG tablet Take 500 mg by mouth every 6 (six) hours as needed (for pain.).   Yes Historical Provider, MD  ibuprofen (ADVIL,MOTRIN) 200 MG tablet Take 800 mg by mouth every 6 (six) hours as needed (for pain.).   Yes Historical Provider, MD   BP 104/60 mmHg  Pulse 81  Temp(Src) 98.9 F (37.2 C) (Oral)  Resp 16  Ht  (1.651 m)  Wt 58.968 kg  BMI 21.63 kg/m2  SpO2 96%  LMP 10/15/2015   Physical Exam  Constitutional: She is oriented to person, place, and time. She appears well-developed and well-nourished. No distress.  Patient appears uncomfortable. She is very mobile in the bed. She is whimpering.  HENT:  Head: Normocephalic and atraumatic.  Eyes: Conjunctivae and EOM are normal. No scleral icterus.  Neck: Normal range of motion.  Cardiovascular: Normal rate, regular rhythm and intact distal pulses.   Pulmonary/Chest: Effort normal. No respiratory distress. She has no wheezes. She has no rales.  Lungs clear to auscultation bilaterally.  Abdominal: Soft. She exhibits no distension. There is tenderness. There is no rebound and no guarding.  Tenderness to the suprapubic abdomen. No peritoneal signs or guarding. Abdomen is soft. Bowel sounds normoactive.  Genitourinary: There is no rash, tenderness, lesion or injury on the right labia. There is no rash, tenderness, lesion or  injury on the left labia. Uterus is tender (mild). Cervix exhibits no motion tenderness and no friability. Right adnexum displays no tenderness. Left adnexum displays tenderness (mild). No bleeding in the vagina. No vaginal discharge found.  Musculoskeletal: Normal range of motion.  Neurological: She is alert and oriented to person, place, and time. She exhibits normal muscle tone. Coordination normal.  Skin:  Skin is warm and dry. No rash noted. She is not diaphoretic. No erythema. No pallor.  Psychiatric: She has a normal mood and affect. Her behavior is normal.  Nursing note and vitals reviewed.   ED Course  Procedures (including critical care time) Labs Review Labs Reviewed  WET PREP, GENITAL - Abnormal; Notable for the following:    WBC, Wet Prep HPF POC MANY (*)    All other components within normal limits  GC/CHLAMYDIA PROBE AMP ([place]) NOT AT [facility]   Results for orders placed or performed during the hospital encounter of 10/29/15  Lipase, blood  Result Value Ref Range   Lipase 22 11 - 51 U/L  Comprehensive metabolic panel  Result Value Ref Range   Sodium 138 135 - 145 mmol/L   Potassium 3.6 3.5 - 5.1 mmol/L   Chloride 108 101 - 111 mmol/L   CO2 24 22 - 32 mmol/L   Glucose, Bld 154 (H) 65 - 99 mg/dL   BUN 6 6 - 20 mg/dL   Creatinine, Ser 5.78 0.44 - 1.00 mg/dL   Calcium 9.1 8.9 - 46.9 mg/dL   Total Protein 7.7 6.5 - 8.1 g/dL   Albumin 4.4 3.5 - 5.0 g/dL   AST 16 15 - 41 U/L   ALT 9 (L) 14 - 54 U/L   Alkaline Phosphatase 40 38 - 126 U/L   Total Bilirubin 0.4 0.3 - 1.2 mg/dL   GFR calc non Af Amer >60 >60 mL/min   GFR calc Af Amer >60 >60 mL/min   Anion gap 6 5 - 15  CBC  Result Value Ref Range   WBC 8.1 4.0 - 10.5 K/uL   RBC 4.26 3.87 - 5.11 MIL/uL   Hemoglobin 12.8 12.0 - 15.0 g/dL   HCT 62.9 52.8 - 41.3 %   MCV 88.5 78.0 - 100.0 fL   MCH 30.0 26.0 - 34.0 pg   MCHC 34.0 30.0 - 36.0 g/dL   RDW 24.4 01.0 - 27.2 %   Platelets 242 150 - 400 K/uL  Urinalysis, Routine w reflex microscopic (not at [facility])  Result Value Ref Range   Color, Urine YELLOW YELLOW   APPearance CLEAR CLEAR   Specific Gravity, Urine 1.022 1.005 - 1.030   pH 7.5 5.0 - 8.0   Glucose, UA 100 (A) NEGATIVE mg/dL   Hgb urine dipstick NEGATIVE NEGATIVE   Bilirubin Urine NEGATIVE NEGATIVE   Ketones, ur 15 (A) NEGATIVE mg/dL   Protein, ur NEGATIVE NEGATIVE mg/dL   Nitrite NEGATIVE NEGATIVE    Leukocytes, UA NEGATIVE NEGATIVE  I-Stat beta hCG blood, ED ([facility], [facility], [facility] only)  Result Value Ref Range   I-stat hCG, quantitative <5.0 <5 mIU/mL   Comment 3           Imaging Review US Transvaginal Non-ob  10/30/2015  CLINICAL DATA:  Acute onset of pelvic and left lower quadrant abdominal pain. Initial encounter. EXAM: TRANSABDOMINAL AND TRANSVAGINAL ULTRASOUND OF PELVIS DOPPLER ULTRASOUND OF OVARIES TECHNIQUE: Both transabdominal and transvaginal ultrasound examinations of the pelvis were performed. Transabdominal technique was performed for global imaging of the pelvis including uterus, ovaries,  adnexal regions, and pelvic cul-de-sac. It was necessary to proceed with endovaginal exam following the transabdominal exam to visualize the ovaries in greater detail. Color and duplex Doppler ultrasound was utilized to evaluate blood flow to the ovaries. COMPARISON:  Pelvic ultrasound performed 02/13/2013 FINDINGS: Uterus Measurements: 7.3 x 4.4 x 4.9 cm. No fibroids or other mass visualized. Endometrium Thickness: 0.5 cm.  No focal abnormality visualized. Right ovary Measurements: 3.4 x 1.4 x 1.7 cm. Normal appearance/no adnexal mass. Left ovary Measurements: 10.0 x 5.9 x 5.8 cm. A 4.6 x 4.4 x 3.2 cm complex cystic structure demonstrates an apparent mural nodule. There is also a complex heterogeneous region of soft tissue echogenicity at the left ovary, measuring 3.7 x 3.7 x 3.6 cm, of uncertain significance. Pulsed Doppler evaluation of both ovaries demonstrates normal low-resistance arterial and venous waveforms. Other findings A moderate amount of free fluid is seen within the pelvic cul-de-sac. IMPRESSION: 1. 4.6 cm left adnexal complex cystic structure demonstrates an apparent mural nodule. Complex heterogeneous region of soft tissue echogenicity at the left ovary, measuring 3.7 cm, of uncertain significance. MRI of the pelvis would be helpful for further evaluation, when and as deemed clinically  appropriate. 2. Moderate amount of free fluid within the pelvic cul-de-sac. 3. No evidence for ovarian torsion. 4. Uterus unremarkable in appearance. Electronically Signed   By: Roanna Raider M.D.   On: 10/30/2015 05:04   US Pelvis Complete  10/30/2015  CLINICAL DATA:  Acute onset of pelvic and left lower quadrant abdominal pain. Initial encounter. EXAM: TRANSABDOMINAL AND TRANSVAGINAL ULTRASOUND OF PELVIS DOPPLER ULTRASOUND OF OVARIES TECHNIQUE: Both transabdominal and transvaginal ultrasound examinations of the pelvis were performed. Transabdominal technique was performed for global imaging of the pelvis including uterus, ovaries, adnexal regions, and pelvic cul-de-sac. It was necessary to proceed with endovaginal exam following the transabdominal exam to visualize the ovaries in greater detail. Color and duplex Doppler ultrasound was utilized to evaluate blood flow to the ovaries. COMPARISON:  Pelvic ultrasound performed 02/13/2013 FINDINGS: Uterus Measurements: 7.3 x 4.4 x 4.9 cm. No fibroids or other mass visualized. Endometrium Thickness: 0.5 cm.  No focal abnormality visualized. Right ovary Measurements: 3.4 x 1.4 x 1.7 cm. Normal appearance/no adnexal mass. Left ovary Measurements: 10.0 x 5.9 x 5.8 cm. A 4.6 x 4.4 x 3.2 cm complex cystic structure demonstrates an apparent mural nodule. There is also a complex heterogeneous region of soft tissue echogenicity at the left ovary, measuring 3.7 x 3.7 x 3.6 cm, of uncertain significance. Pulsed Doppler evaluation of both ovaries demonstrates normal low-resistance arterial and venous waveforms. Other findings A moderate amount of free fluid is seen within the pelvic cul-de-sac. IMPRESSION: 1. 4.6 cm left adnexal complex cystic structure demonstrates an apparent mural nodule. Complex heterogeneous region of soft tissue echogenicity at the left ovary, measuring 3.7 cm, of uncertain significance. MRI of the pelvis would be helpful for further evaluation, when and  as deemed clinically appropriate. 2. Moderate amount of free fluid within the pelvic cul-de-sac. 3. No evidence for ovarian torsion. 4. Uterus unremarkable in appearance. Electronically Signed   By: Roanna Raider M.D.   On: 10/30/2015 05:04   [place] Art/ven Flow Abd Pelv Doppler  10/30/2015  CLINICAL DATA:  Acute onset of pelvic and left lower quadrant abdominal pain. Initial encounter. EXAM: TRANSABDOMINAL AND TRANSVAGINAL ULTRASOUND OF PELVIS DOPPLER ULTRASOUND OF OVARIES TECHNIQUE: Both transabdominal and transvaginal ultrasound examinations of the pelvis were performed. Transabdominal technique was performed for global imaging of the pelvis including uterus,  ovaries, adnexal regions, and pelvic cul-de-sac. It was necessary to proceed with endovaginal exam following the transabdominal exam to visualize the ovaries in greater detail. Color and duplex Doppler ultrasound was utilized to evaluate blood flow to the ovaries. COMPARISON:  Pelvic ultrasound performed 02/13/2013 FINDINGS: Uterus Measurements: 7.3 x 4.4 x 4.9 cm. No fibroids or other mass visualized. Endometrium Thickness: 0.5 cm.  No focal abnormality visualized. Right ovary Measurements: 3.4 x 1.4 x 1.7 cm. Normal appearance/no adnexal mass. Left ovary Measurements: 10.0 x 5.9 x 5.8 cm. A 4.6 x 4.4 x 3.2 cm complex cystic structure demonstrates an apparent mural nodule. There is also a complex heterogeneous region of soft tissue echogenicity at the left ovary, measuring 3.7 x 3.7 x 3.6 cm, of uncertain significance. Pulsed Doppler evaluation of both ovaries demonstrates normal low-resistance arterial and venous waveforms. Other findings A moderate amount of free fluid is seen within the pelvic cul-de-sac. IMPRESSION: 1. 4.6 cm left adnexal complex cystic structure demonstrates an apparent mural nodule. Complex heterogeneous region of soft tissue echogenicity at the left ovary, measuring 3.7 cm, of uncertain significance. MRI of the pelvis would be  helpful for further evaluation, when and as deemed clinically appropriate. 2. Moderate amount of free fluid within the pelvic cul-de-sac. 3. No evidence for ovarian torsion. 4. Uterus unremarkable in appearance. Electronically Signed   By: Roanna Raider M.D.   On: 10/30/2015 05:04   Dg Abd 2 Views  10/30/2015  CLINICAL DATA:  Left lower quadrant abdominal pain since this morning. Nausea and vomiting. EXAM: ABDOMEN - 2 VIEW COMPARISON:  None. FINDINGS: Scattered gas and stool in the colon. No small or large bowel distention. No free intra-abdominal air. No abnormal air-fluid levels. No radiopaque stones. Visualized bones appear intact. IMPRESSION: Normal nonobstructive bowel gas pattern. Electronically Signed   By: Burman Nieves M.D.   On: 10/30/2015 02:34   I have personally reviewed and evaluated these images and lab results as part of my medical decision-making.   EKG Interpretation None       0530 - Spoke with Dr. Jackelyn Knife, on call for [facility]. Have verbally communicated results of pelvic ultrasound. Question initiation of antibiotics for PID coverage which Dr. Jackelyn Knife does not believe is indicated given lack of infectious symptoms. He recommends, instead, outpatient office follow up in the coming days. Patient to call the office to schedule an appointment. MDM   Final diagnoses:  Abdominal pain  Cyst of left ovary    24 year old female presents to the emergency department for further evaluation of left lower quadrant/suprapubic abdominal pain which began at 8:30 AM. Patient presented to the emergency department earlier today, but left prior to being seen. Her labs from this visit were reviewed which are noncontributory. Patient has no leukocytosis or fever. Her urine does not suggest infection and her urine pregnancy is negative. Patient with some mild left adnexal tenderness which prompted completion of a pelvic ultrasound. Ultrasound revealed a 4.6 cm left adnexal complex cystic  structure consistent with an apparent mural nodule.  Workup and ultrasound findings discussed with Dr. Jackelyn Knife of [facility]. He recommends outpatient follow-up for further evaluation of findings. Dr. Jackelyn Knife does not believe antibiotics are indicated as patient has had no infectious symptoms. She does have a GC/Chlamydia culture pending. Patient with adequate pain control after Toradol. Will discharge with course of NSAIDs for management of pain. Patient instructed to follow-up with her OB/GYN. She verbalizes comfort and understanding with this plan. Return precautions given at discharge. Patient  discharged in satisfactory condition with no unaddressed concerns; VSS.   Filed Vitals:   10/29/15 1945 10/30/15 0039 10/30/15 0448 10/30/15 0552  BP: 125/73 125/75 104/60 105/48  Pulse: 91 85 81 73  Temp: 98.4 F (36.9 C)  98.9 F (37.2 C)   TempSrc:   Oral   Resp: 18 14 16 14   Height: 5\' 5"  (1.651 m)     Weight: 58.968 kg     SpO2: 100% 99% 96% 100%     Antony MaduraKelly Austyn Seier, PA-C 10/31/15 0007  Derwood KaplanAnkit Nanavati, MD 10/31/15 [phone]

## 2015-11-01 LAB — GC/CHLAMYDIA PROBE AMP (~~LOC~~) NOT AT ARMC
CHLAMYDIA, DNA PROBE: NEGATIVE
NEISSERIA GONORRHEA: NEGATIVE

## 2017-10-28 ENCOUNTER — Other Ambulatory Visit: Payer: Self-pay

## 2017-10-28 ENCOUNTER — Encounter (HOSPITAL_COMMUNITY): Payer: Self-pay

## 2017-10-28 ENCOUNTER — Emergency Department (HOSPITAL_COMMUNITY): Payer: BLUE CROSS/BLUE SHIELD

## 2017-10-28 ENCOUNTER — Emergency Department (HOSPITAL_COMMUNITY)
Admission: EM | Admit: 2017-10-28 | Discharge: 2017-10-28 | Disposition: A | Discharge status: Home / Self Care | Payer: BLUE CROSS/BLUE SHIELD | Attending: Emergency Medicine | Admitting: Emergency Medicine

## 2017-10-28 DIAGNOSIS — J111 Influenza due to unidentified influenza virus with other respiratory manifestations: Secondary | ICD-10-CM

## 2017-10-28 DIAGNOSIS — R0981 Nasal congestion: Secondary | ICD-10-CM | POA: Diagnosis present

## 2017-10-28 DIAGNOSIS — Z79899 Other long term (current) drug therapy: Secondary | ICD-10-CM | POA: Insufficient documentation

## 2017-10-28 DIAGNOSIS — R05 Cough: Secondary | ICD-10-CM | POA: Diagnosis not present

## 2017-10-28 DIAGNOSIS — R69 Illness, unspecified: Secondary | ICD-10-CM

## 2017-10-28 DIAGNOSIS — J069 Acute upper respiratory infection, unspecified: Secondary | ICD-10-CM | POA: Diagnosis not present

## 2017-10-28 LAB — RAPID STREP SCREEN (MED CTR MEBANE ONLY): Streptococcus, Group A Screen (Direct): NEGATIVE

## 2017-10-28 MED ORDER — ACETAMINOPHEN 325 MG PO TABS
650.0000 mg | ORAL_TABLET | Freq: Once | ORAL | Status: AC | PRN
  Administered 2017-10-28: 650 mg via ORAL
  Filled 2017-10-28: qty 2

## 2017-10-28 MED ORDER — FLUTICASONE PROPIONATE 50 MCG/ACT NA SUSP: 1.0000 | Freq: Every day | NASAL | 2 refills | Status: DC

## 2017-10-28 MED ORDER — BENZONATATE 100 MG PO CAPS: 100.0000 mg | ORAL_CAPSULE | Freq: Three times a day (TID) | ORAL | 0 refills | Status: DC

## 2017-10-28 NOTE — ED Provider Notes (Signed)
[facility]-EMERGENCY DEPT Provider Note   CSN: [id number] Arrival date & time: 10/28/17  1530     History   Chief Complaint Chief Complaint  Patient presents with  . Nasal Congestion  . Fever  . Generalized Body Aches    HPI Terry Kennedy is a 26 y.o. female with no significant past medical history, who presents to ED for evaluation of 4-day history of nasal congestion, productive cough, fever, right ear stuffiness.  Symptoms began suddenly at night.  She has tried Mucinex and hot teas with mild relief in her symptoms.  She does report a T-max of 101.  She reports sick contacts at home during the holidays with similar symptoms.  Denies any chest pain, shortness of breath, hemoptysis, drainage from the ear, nausea, vomiting, abdominal pain.  HPI  Past Medical History:  Diagnosis Date  . No pertinent past medical history   . Normal pregnancy, first 07/15/2013  . SVD (spontaneous vaginal delivery) 07/16/2013    Patient Active Problem List   Diagnosis Date Noted  . Normal pregnancy, first 07/15/2013  . Supervision of low-risk pregnancy 01/02/2013    Past Surgical History:  Procedure Laterality Date  . NO PAST SURGERIES      OB History    Gravida Para Term Preterm AB Living   1 1 1     1    SAB TAB Ectopic Multiple Live Births           1       Home Medications    Prior to Admission medications   Medication Sig Start Date End Date Taking? Authorizing Provider  acetaminophen (TYLENOL) 500 MG tablet Take 500 mg by mouth every 6 (six) hours as needed (for pain.).    [provider]  benzonatate (TESSALON) 100 MG capsule Take 1 capsule (100 mg total) by mouth every 8 (eight) hours. 10/28/17   Audy Dauphine, PA-C  fluticasone (FLONASE) 50 MCG/ACT nasal spray Place 1 spray into both nostrils daily. 10/28/17   Zarriah Starkel, PA-C  ibuprofen (ADVIL,MOTRIN) 200 MG tablet Take 800 mg by mouth every 6 (six) hours as needed (for pain.).     [provider]  naproxen (NAPROSYN) 500 MG tablet Take 1 tablet (500 mg total) by mouth 2 (two) times daily. 10/30/15   Antony MaduraHumes, Kelly, PA-C  traMADol (ULTRAM) 50 MG tablet Take 1 tablet (50 mg total) by mouth every 6 (six) hours as needed for severe pain. 10/30/15   Antony MaduraHumes, Kelly, PA-C    Family History Family History  Problem Relation Age of Onset  . Fibroids Other     Social History Social History   Tobacco Use  . Smoking status: Never Smoker  . Smokeless tobacco: Never Used  Substance Use Topics  . Alcohol use: No  . Drug use: No     Allergies   Patient has no known allergies.   Review of Systems Review of Systems  Constitutional: Positive for appetite change, chills and fever.  HENT: Positive for congestion, ear pain, postnasal drip, rhinorrhea and sore throat. Negative for ear discharge, facial swelling, hearing loss, sneezing, trouble swallowing and voice change.   Eyes: Negative for pain, discharge, redness and itching.  Respiratory: Positive for cough. Negative for shortness of breath.   Cardiovascular: Negative for chest pain.  Gastrointestinal: Negative for abdominal pain, nausea and vomiting.  Skin: Negative for rash.  Neurological: Negative for dizziness, weakness, light-headedness and headaches.     Physical Exam Updated Vital Signs BP 121/78 (  BP Location: Left Arm)   Pulse (!) 117   Temp 98.7 F (37.1 C) (Oral)   Resp 20   LMP 10/28/2017   SpO2 99%   Physical Exam  Constitutional: She appears well-developed and well-nourished. No distress.  HENT:  Head: Normocephalic and atraumatic.  Right Ear: A middle ear effusion is present.  Left Ear: A middle ear effusion is present.  Nose: Mucosal edema present.  Mouth/Throat: Uvula is midline. Posterior oropharyngeal erythema present. No posterior oropharyngeal edema. No tonsillar exudate.  Patient does not appear to be in acute distress. No trismus or drooling present. No pooling of secretions.  Patient is tolerating secretions and is not in respiratory distress. No neck pain or tenderness to palpation of the neck. Full active and passive range of motion of the neck. No evidence of RPA or PTA.  Eyes: Conjunctivae and EOM are normal. Pupils are equal, round, and reactive to light. Right eye exhibits no discharge. Left eye exhibits no discharge. No scleral icterus.  Neck: Normal range of motion.  Cardiovascular: Normal rate, regular rhythm and normal heart sounds.  Pulmonary/Chest: Effort normal and breath sounds normal. No respiratory distress.  Neurological: She is alert.  Skin: No rash noted. She is not diaphoretic.  Psychiatric: She has a normal mood and affect.  Nursing note and vitals reviewed.    ED Treatments / Results  Labs (all labs ordered are listed, but only abnormal results are displayed) Labs Reviewed  RAPID STREP SCREEN (NOT AT [facility])  CULTURE, GROUP A STREP [facility])    EKG  EKG Interpretation None       Radiology Dg Chest 2 View  Result Date: 10/28/2017 CLINICAL DATA:  26 year old female with productive cough and fever for 4 days. EXAM: CHEST  2 VIEW COMPARISON:  None. FINDINGS: The heart size and mediastinal contours are within normal limits. Both lungs are clear. The visualized skeletal structures are unremarkable. IMPRESSION: No active cardiopulmonary disease. Electronically Signed   By: Sande Brothers M.D.   On: 10/28/2017 18:26    Procedures Procedures (including critical care time)  Medications Ordered in ED Medications  acetaminophen (TYLENOL) tablet 650 mg (650 mg Oral Given 10/28/17 1716)     Initial Impression / Assessment and Plan / ED Course  I have reviewed the triage vital signs and the nursing notes.  Pertinent labs & imaging results that were available during my care of the patient were reviewed by me and considered in my medical decision making (see chart for details).     Patient presents to ED for evaluation of nasal  congestion, productive cough, fever, right ear stuffiness for the past 4 days.  Sick contacts at home with similar symptoms.  She has tried Mucinex and hot teas with mild relief in her symptoms.  Here she is febrile with a low-grade fever.  She looks overall well.  There is nasal congestion and productive cough noted.  Her TMs are clear with mid ear infusions in both.  Lungs are clear to auscultation bilaterally.  She denies any chest pain, shortness of breath, hemoptysis.  Strep test returned as negative.  There is some posterior oropharyngeal erythema but no signs of RPA or PTA on my examination.  Oxygen saturations at 99percent on room air.  Chest x-ray unremarkable.  I suspect that her symptoms are due to a viral illness.  I did talk to her about flu testing in the benefits and risk of treatment now that she has passed the 48-hour window and she  declines testing or treatment at this time.  There are no signs of acute otitis media, pneumonia or severe bacterial infection noted.  We will give her supportive treatment with Tessalon Perles, Flonase and advised her to push fluids and ensure adequate p.o. intake.  Fever has improved here.  Advised to follow-up with primary care for further evaluation.  Patient appears stable for discharge at this time.  Strict return precautions given.  Final Clinical Impressions(s) / ED Diagnoses   Final diagnoses:  Viral upper respiratory tract infection  Influenza-like illness  Nasal congestion    ED Discharge Orders        Ordered    benzonatate (TESSALON) 100 MG capsule  Every 8 hours     10/28/17 1859    fluticasone (FLONASE) 50 MCG/ACT nasal spray  Daily     10/28/17 1859       Dietrich PatesKhatri, Josue Kass, PA-C 10/28/17 1908    Lorre NickAllen, Anthony, MD 10/28/17 2022

## 2017-10-28 NOTE — Discharge Instructions (Signed)
Please read the attached information regarding your condition. Take Tessalon Perles as needed for cough. Use nasal spray as directed to help with congestion. Take Tylenol or ibuprofen alternating to help with pain and fevers. Please push fluids and increase hydration and food intake to ensure adequate energy. Follow-up with your primary care provider for further evaluation. Return to ED for worsening symptoms, chest pain, coughing up blood, vomiting, loss of consciousness or lightheadedness.

## 2017-10-28 NOTE — ED Triage Notes (Signed)
Pt complaining of R sided otalgia, nasal congestion, cough, and generalized body aches since Thursday. She denies nausea, vomiting, or diarrhea. She states that her T max at home was 101. A&Ox4. Ambulatory,

## 2017-10-31 LAB — CULTURE, GROUP A STREP (THRC)

## 2017-11-03 ENCOUNTER — Encounter (HOSPITAL_COMMUNITY): Payer: Self-pay | Admitting: Family Medicine

## 2017-11-03 ENCOUNTER — Telehealth: Payer: Self-pay | Admitting: *Deleted

## 2017-11-03 ENCOUNTER — Ambulatory Visit (HOSPITAL_COMMUNITY)
Admission: EM | Admit: 2017-11-03 | Discharge: 2017-11-03 | Disposition: A | Discharge status: Home / Self Care | Payer: BLUE CROSS/BLUE SHIELD | Attending: Family Medicine | Admitting: Family Medicine

## 2017-11-03 DIAGNOSIS — J40 Bronchitis, not specified as acute or chronic: Secondary | ICD-10-CM | POA: Diagnosis not present

## 2017-11-03 DIAGNOSIS — J069 Acute upper respiratory infection, unspecified: Secondary | ICD-10-CM

## 2017-11-03 DIAGNOSIS — B9789 Other viral agents as the cause of diseases classified elsewhere: Secondary | ICD-10-CM | POA: Diagnosis not present

## 2017-11-03 MED ORDER — HYDROCODONE-HOMATROPINE 5-1.5 MG/5ML PO SYRP: 5.0000 mL | ORAL_SOLUTION | Freq: Four times a day (QID) | ORAL | 0 refills | Status: DC | PRN

## 2017-11-03 MED ORDER — PREDNISONE 20 MG PO TABS: ORAL_TABLET | ORAL | 0 refills | Status: DC

## 2017-11-03 NOTE — ED Notes (Signed)
Patient has questions about work note

## 2017-11-03 NOTE — ED Notes (Signed)
Dr Milus Glazierlauenstein says note should cover this illness since monday

## 2017-11-03 NOTE — ED Provider Notes (Signed)
[facility]   [id number] 11/03/17 Arrival Time: 1514   SUBJECTIVE:  Sherlie Banekeia B Featherston is a 26 y.o. female who presents to the urgent care with complaint of continued URI symptoms with diarrhea. Reports that she was seen at [facility] about a week ago and told "flu like symptoms". Reports that she feels better but cant ged rid of the cough. Taking mucinex and robitussin.  She works at [employer]  Note from last week: Sherlie Banekeia B Squitieri is a 26 y.o. female with no significant past medical history, who presents to ED for evaluation of 4-day history of nasal congestion, productive cough, fever, right ear stuffiness.  Symptoms began suddenly at night.  She has tried Mucinex and hot teas with mild relief in her symptoms.  She does report a T-max of 101.  She reports sick contacts at home during the holidays with similar symptoms.  Denies any chest pain, shortness of breath, hemoptysis, drainage from the ear, nausea, vomiting, abdominal pain.  Past Medical History:  Diagnosis Date  . No pertinent past medical history   . Normal pregnancy, first 07/15/2013  . SVD (spontaneous vaginal delivery) 07/16/2013   Family History  Problem Relation Age of Onset  . Fibroids Other    Social History   Socioeconomic History  . Marital status: Single    Spouse name: Not on file  . Number of children: Not on file  . Years of education: Not on file  . Highest education level: Not on file  Social Needs  . Financial resource strain: Not on file  . Food insecurity - worry: Not on file  . Food insecurity - inability: Not on file  . Transportation needs - medical: Not on file  . Transportation needs - non-medical: Not on file  Occupational History  . Not on file  Tobacco Use  . Smoking status: Never Smoker  . Smokeless tobacco: Never Used  Substance and Sexual Activity  . Alcohol use: No  . Drug use: No  . Sexual activity: Not Currently    Birth control/protection: None  Other Topics  Concern  . Not on file  Social History Narrative  . Not on file   No outpatient medications have been marked as taking for the 11/03/17 encounter [facility](Hospital Encounter).   No Known Allergies    ROS: As per HPI, remainder of ROS negative.   OBJECTIVE:   Vitals:   11/03/17 1549  BP: 117/74  Pulse: (!) 105  Resp: 18  Temp: 98.2 F (36.8 C)  SpO2: 100%     General appearance: alert; no distress Eyes: PERRL; EOMI; conjunctiva normal HENT: normocephalic; atraumatic; TMs normal, canal normal, external ears normal without trauma; nasal mucosa normal; oral mucosa normal Neck: supple Lungs: clear to auscultation bilaterally Heart: regular rate and rhythm Back: no CVA tenderness Extremities: no cyanosis or edema; symmetrical with no gross deformities Skin: warm and dry Neurologic: normal gait; grossly normal Psychological: alert and cooperative; normal mood and affect      Labs:  Results for orders placed or performed during the hospital encounter of 10/28/17  Rapid strep screen  Result Value Ref Range   Streptococcus, Group A Screen (Direct) NEGATIVE NEGATIVE  Culture, group A strep  Result Value Ref Range   Specimen Description THROAT    Special Requests NONE    Culture      NO GROUP A STREP (S.PYOGENES) ISOLATED Performed at [facility], [address] [place], [place] [address]    Report  Status 10/31/2017 FINAL       ASSESSMENT & PLAN:  1. Viral URI with cough   2. Bronchitis     Meds ordered this encounter  Medications  . predniSONE (DELTASONE) 20 MG tablet    Sig: Two daily with food    Dispense:  10 tablet    Refill:  0  . HYDROcodone-homatropine (HYDROMET) 5-1.5 MG/5ML syrup    Sig: Take 5 mLs by mouth every 6 (six) hours as needed for cough.    Dispense:  60 mL    Refill:  0    Reviewed expectations re: course of current medical issues. Questions answered. Outlined signs and symptoms indicating need for more acute  intervention. Patient verbalized understanding. After Visit Summary given.     Elvina SidleLauenstein, Megon Kalina, MD 11/03/17 1620

## 2017-11-03 NOTE — ED Triage Notes (Signed)
Pt here for continued URI symptoms with diarrhea. Reports that she was seen [facility] about a week ago and told "flu like symptoms". Reports that she feels better but cant ged rid of the cough. Taking mucinex and robitussin.

## 2017-11-03 NOTE — Telephone Encounter (Signed)
Pt coughing incessantly throughout telephone call. CM encouraged her to visit [facility] and be seen again as sx as not subsided. Pt appreciative stating she had been febrile to 102 and unable to RTW. No further CM needs at this time.

## 2017-11-06 ENCOUNTER — Ambulatory Visit: Payer: BLUE CROSS/BLUE SHIELD | Admitting: Family Medicine

## 2017-11-22 ENCOUNTER — Telehealth (HOSPITAL_COMMUNITY): Payer: Self-pay | Admitting: Emergency Medicine

## 2017-11-22 NOTE — Telephone Encounter (Signed)
Received note to call pt  Pt requesting FMLA form to be completed.... Unfortunately, [facility] is unable to complete forms  Pt sts ED sent her here so we can complete them and she also sts [facility].   Adv her to call back [facility] and to est herself w/a PCP.   Pt verb understanding.

## 2019-10-14 ENCOUNTER — Other Ambulatory Visit: Payer: Self-pay

## 2019-10-14 DIAGNOSIS — Z20822 Contact with and (suspected) exposure to covid-19: Secondary | ICD-10-CM

## 2019-10-16 LAB — NOVEL CORONAVIRUS, NAA: SARS-CoV-2, NAA: NOT DETECTED

## 2019-11-07 ENCOUNTER — Other Ambulatory Visit: Payer: Self-pay

## 2019-11-07 DIAGNOSIS — Z20822 Contact with and (suspected) exposure to covid-19: Secondary | ICD-10-CM

## 2019-11-09 ENCOUNTER — Telehealth: Payer: Self-pay | Admitting: *Deleted

## 2019-11-09 NOTE — Telephone Encounter (Signed)
Patient called for results ,still pending . 

## 2019-11-10 LAB — NOVEL CORONAVIRUS, NAA: SARS-CoV-2, NAA: DETECTED — AB

## 2019-11-12 ENCOUNTER — Ambulatory Visit: Payer: BLUE CROSS/BLUE SHIELD | Admitting: Family Medicine

## 2019-11-20 ENCOUNTER — Other Ambulatory Visit: Payer: Medicaid Other

## 2019-12-31 ENCOUNTER — Ambulatory Visit: Payer: Medicaid Other

## 2020-01-02 ENCOUNTER — Ambulatory Visit: Payer: Medicaid Other | Attending: Internal Medicine

## 2020-01-02 DIAGNOSIS — Z20822 Contact with and (suspected) exposure to covid-19: Secondary | ICD-10-CM

## 2020-01-03 LAB — NOVEL CORONAVIRUS, NAA: SARS-CoV-2, NAA: NOT DETECTED

## 2020-01-14 ENCOUNTER — Other Ambulatory Visit: Payer: Self-pay

## 2020-01-15 ENCOUNTER — Ambulatory Visit (INDEPENDENT_AMBULATORY_CARE_PROVIDER_SITE_OTHER): Payer: BC Managed Care – PPO | Admitting: Family Medicine

## 2020-01-15 ENCOUNTER — Encounter: Payer: Self-pay | Admitting: Family Medicine

## 2020-01-15 VITALS — BP 102/64 | Temp 97.8°F | Ht 65.0 in | Wt 192.0 lb

## 2020-01-15 DIAGNOSIS — Z7689 Persons encountering health services in other specified circumstances: Secondary | ICD-10-CM

## 2020-01-15 DIAGNOSIS — Z862 Personal history of diseases of the blood and blood-forming organs and certain disorders involving the immune mechanism: Secondary | ICD-10-CM

## 2020-01-15 DIAGNOSIS — E559 Vitamin D deficiency, unspecified: Secondary | ICD-10-CM

## 2020-01-15 DIAGNOSIS — R635 Abnormal weight gain: Secondary | ICD-10-CM | POA: Diagnosis not present

## 2020-01-15 DIAGNOSIS — R5383 Other fatigue: Secondary | ICD-10-CM | POA: Diagnosis not present

## 2020-01-15 DIAGNOSIS — Z91018 Allergy to other foods: Secondary | ICD-10-CM

## 2020-01-15 LAB — CBC
HCT: 34.7 % — ABNORMAL LOW (ref 36.0–46.0)
Hemoglobin: 11.2 g/dL — ABNORMAL LOW (ref 12.0–15.0)
MCHC: 32.3 g/dL (ref 30.0–36.0)
MCV: 81.5 fl (ref 78.0–100.0)
Platelets: 338 10*3/uL (ref 150.0–400.0)
RBC: 4.25 Mil/uL (ref 3.87–5.11)
RDW: 15.9 % — ABNORMAL HIGH (ref 11.5–15.5)
WBC: 4.6 10*3/uL (ref 4.0–10.5)

## 2020-01-15 LAB — TSH: TSH: 1.42 u[IU]/mL (ref 0.35–4.50)

## 2020-01-15 LAB — T4, FREE: Free T4: 0.91 ng/dL (ref 0.60–1.60)

## 2020-01-15 LAB — VITAMIN D 25 HYDROXY (VIT D DEFICIENCY, FRACTURES): VITD: 17.15 ng/mL — ABNORMAL LOW (ref 30.00–100.00)

## 2020-01-15 MED ORDER — FERROUS SULFATE 300 (60 FE) MG/5ML PO SYRP: 300.0000 mg | ORAL_SOLUTION | Freq: Every day | ORAL | 3 refills | Status: DC

## 2020-01-15 MED ORDER — VITAMIN D (ERGOCALCIFEROL) 1.25 MG (50000 UNIT) PO CAPS: 50000.0000 [IU] | ORAL_CAPSULE | ORAL | 0 refills | Status: DC

## 2020-01-15 NOTE — Progress Notes (Signed)
Patient presents to clinic today to establish care.  SUBJECTIVE: PMH: Pt is a 29 yo female with pmh sig for anemia. Pt followed by [facility].  Previously seen by Janyth Contes, MD.  Pt endorses h/o COVID-19 infection in Dec 2020.  H/o anemia: -not currently on iron supplement -endorses fatigue, weight gain  Food allergy?: -pt notes facial edema and hives the next day after eating mushrooms. -has picture on her phone -avoiding mushrooms -interested in allergy testing  Social hx: Pt is an Building control surveyor.  Trying to stay safe Pt denies tobacco and drug use.  Past Medical History:  Diagnosis Date  . No pertinent past medical history   . Normal pregnancy, first 07/15/2013  . SVD (spontaneous vaginal delivery) 07/16/2013    Past Surgical History:  Procedure Laterality Date  . NO PAST SURGERIES      Current Outpatient Medications on File Prior to Visit  Medication Sig Dispense Refill  . acetaminophen (TYLENOL) 500 MG tablet Take 500 mg by mouth every 6 (six) hours as needed (for pain.).     No current facility-administered medications on file prior to visit.    No Known Allergies  Family History  Problem Relation Age of Onset  . Fibroids Other     ROS General: Denies fever, chills, night sweats, changes in weight, changes in appetite HEENT: Denies headaches, ear pain, changes in vision, rhinorrhea, sore throat CV: Denies CP, palpitations, SOB, orthopnea Pulm: Denies SOB, cough, wheezing GI: Denies abdominal pain, nausea, vomiting, diarrhea, constipation GU: Denies dysuria, hematuria, frequency, vaginal discharge Msk: Denies muscle cramps, joint pains Neuro: Denies weakness, numbness, tingling Skin: Denies rashes, bruising Psych: Denies depression, anxiety, hallucinations   BP 102/64   Temp 97.8 F (36.6 C) (Temporal)   Ht 5\' 5"  (1.651 m)   Wt 192 lb (87.1 kg)   LMP 12/29/2019 (Exact Date)   BMI 31.95 kg/m   Physical Exam Gen.  Pleasant, well developed, well-nourished, in NAD HEENT - [place]/AT, PERRL, EOMI, conjunctive clear, no scleral icterus, no nasal drainage, pharynx without erythema or exudate. Lungs: no use of accessory muscles, CTAB, no wheezes, rales or rhonchi Cardiovascular: RRR,  No r/g/m, no peripheral edema Abdomen: BS present, soft, nontender,nondistended Musculoskeletal: No deformities, moves all four extremities, no cyanosis or clubbing, normal tone Neuro:  A&Ox3, CN II-XII intact, normal gait Skin:  Warm, dry, intact, no lesions  Recent Results (from the past 2160 hour(s))  Novel Coronavirus, NAA ([facility])     Status: Abnormal   Collection Time: 11/07/19 11:01 AM   Specimen: Nasopharyngeal(NP) swabs in vial transport medium   NASOPHARYNGE  TESTING  Result Value Ref Range   SARS-CoV-2, NAA Detected (A) Not Detected    Comment: This nucleic acid amplification test was developed and its performance characteristics determined by [employer]. Nucleic acid amplification tests include PCR and TMA. This test has not been FDA cleared or approved. This test has been authorized by FDA under an Emergency Use Authorization (EUA). This test is only authorized for the duration of time the declaration that circumstances exist justifying the authorization of the emergency use of in vitro diagnostic tests for detection of SARS-CoV-2 virus and/or diagnosis of COVID-19 infection under section 564(b)(1) of the Act, 21 U.S.C. 601UXN-2(T) (1), unless the authorization is terminated or revoked sooner. When diagnostic testing is negative, the possibility of a false negative result should be considered in the context of a patient's recent exposures and the presence of clinical signs and symptoms consistent with COVID-19.  An individual without symptoms of COVID-19 and who is not shedding SARS-CoV-2 virus would  expect to have a negative (not detected) result in this assay.   Novel Coronavirus, NAA ([facility])      Status: None   Collection Time: 01/02/20  3:16 PM   Specimen: Nasopharyngeal(NP) swabs in vial transport medium   NASOPHARYNGE  TESTING  Result Value Ref Range   SARS-CoV-2, NAA Not Detected Not Detected    Comment: This nucleic acid amplification test was developed and its performance characteristics determined by [employer]. Nucleic acid amplification tests include RT-PCR and TMA. This test has not been FDA cleared or approved. This test has been authorized by FDA under an Emergency Use Authorization (EUA). This test is only authorized for the duration of time the declaration that circumstances exist justifying the authorization of the emergency use of in vitro diagnostic tests for detection of SARS-CoV-2 virus and/or diagnosis of COVID-19 infection under section 564(b)(1) of the Act, 21 U.S.C. 979GXQ-1(J) (1), unless the authorization is terminated or revoked sooner. When diagnostic testing is negative, the possibility of a false negative result should be considered in the context of a patient's recent exposures and the presence of clinical signs and symptoms consistent with COVID-19. An individual without symptoms of COVID-19 and who is not shedding SARS-CoV-2 virus wo uld expect to have a negative (not detected) result in this assay.     Assessment/Plan: Food allergy  -consider Epi pen -continue to avoid  - Plan: Ambulatory referral to Allergy  History of anemia  - Plan: CBC (no diff), ferrous sulfate 300 (60 Fe) MG/5ML syrup  Weight gain  -discussed lifestyle modifications - Plan: TSH, T4, free  Fatigue, unspecified type  - Plan: CBC (no diff), TSH, T4, free, Vitamin D, 25-hydroxy  Encounter to establish care -We reviewed the PMH, PSH, FH, SH, Meds and Allergies. -We provided refills for any medications we will prescribe as needed. -We addressed current concerns per orders and patient instructions. -We have asked for records for pertinent exams, studies,  vaccines and notes from previous providers. -We have advised patient to follow up per instructions below.  Update:   Vitamin D deficiency     -Vitamin D 17.15     - Plan: Vitamin D, Ergocalciferol, (DRISDOL) 1.25 MG (50000 UNIT) CAPS capsule  Microcytic anemia likely 2/2 iron def.  Daily iron supplement    F/u prn in the next few months  Abbe Amsterdam, MD

## 2020-01-15 NOTE — Patient Instructions (Addendum)
Fatigue If you have fatigue, you feel tired all the time and have a lack of energy or a lack of motivation. Fatigue may make it difficult to start or complete tasks because of exhaustion. In general, occasional or mild fatigue is often a normal response to activity or life. However, long-lasting (chronic) or extreme fatigue may be a symptom of a medical condition. Follow these instructions at home: General instructions  Watch your fatigue for any changes.  Go to bed and get up at the same time every day.  Avoid fatigue by pacing yourself during the day and getting enough sleep at night.  Maintain a healthy weight. Medicines  Take over-the-counter and prescription medicines only as told by your health care provider.  Take a multivitamin, if told by your health care provider.  Do not use herbal or dietary supplements unless they are approved by your health care provider. Activity   Exercise regularly, as told by your health care provider.  Use or practice techniques to help you relax, such as yoga, tai chi, meditation, or massage therapy. Eating and drinking   Avoid heavy meals in the evening.  Eat a well-balanced diet, which includes lean proteins, whole grains, plenty of fruits and vegetables, and low-fat dairy products.  Avoid consuming too much caffeine.  Avoid the use of alcohol.  Drink enough fluid to keep your urine pale yellow. Lifestyle  Change situations that cause you stress. Try to keep your work and personal schedule in balance.  Do not use any products that contain nicotine or tobacco, such as cigarettes and e-cigarettes. If you need help quitting, ask your health care provider.  Do not use drugs. Contact a health care provider if:  Your fatigue does not get better.  You have a fever.  You suddenly lose or gain weight.  You have headaches.  You have trouble falling asleep or sleeping through the night.  You feel angry, guilty, anxious, or  sad.  You are unable to have a bowel movement (constipation).  Your skin is dry.  You have swelling in your legs or another part of your body. Get help right away if:  You feel confused.  Your vision is blurry.  You feel faint or you pass out.  You have a severe headache.  You have severe pain in your abdomen, your back, or the area between your waist and hips (pelvis).  You have chest pain, shortness of breath, or an irregular or fast heartbeat.  You are unable to urinate, or you urinate less than normal.  You have abnormal bleeding, such as bleeding from the rectum, vagina, nose, lungs, or nipples.  You vomit blood.  You have thoughts about hurting yourself or others. If you ever feel like you may hurt yourself or others, or have thoughts about taking your own life, get help right away. You can go to your nearest emergency department or call:  Your local emergency services (911 in the [place]  A suicide crisis helpline, such as the [place] at 9164775271. This is open 24 hours a day. Summary  If you have fatigue, you feel tired all the time and have a lack of energy or a lack of motivation.  Fatigue may make it difficult to start or complete tasks because of exhaustion.  Long-lasting (chronic) or extreme fatigue may be a symptom of a medical condition.  Exercise regularly, as told by your health care provider.  Change situations that cause you stress. Try to keep your  work and personal schedule in balance. This information is not intended to replace advice given to you by your health care provider. Make sure you discuss any questions you have with your health care provider. Document Revised: 06/11/2019 Document Reviewed: 08/15/2017 Elsevier Patient Education  2020 [place] Allergy A food allergy is an abnormal reaction to a food (food allergen) by the body's defense system (immune system). Foods that commonly cause  allergies include:  Milk.  Seafood.  Eggs.  Peanuts.  Tree nuts such as pecans, walnuts, and cashews.  Wheat.  Soy. What are the causes? Food allergies happen when the immune system sees a food as harmful and releases chemicals (antibodies) to fight it. What are the signs or symptoms? Symptoms may be mild or severe. They usually start minutes after eating the food, but they can occur even a few hours later. In people with a severe allergy, symptoms can start within seconds. Mild symptoms of this condition include:  Congested nose.  Tingling in the mouth.  An itchy, red rash.  Vomiting.  Diarrhea. In people with a severe allergy, a life-threatening reaction can occur called anaphylaxis. Get help right away if you have symptoms of anaphylaxis, such as:  Feeling warm in the face (flushed). This may include redness.  Itchy, red, swollen areas of skin (hives).  Swelling of the eyes, lips, face, mouth, tongue, or throat.  Difficulty breathing, speaking, or swallowing.  Noisy breathing (wheezing).  Dizziness or light-headedness.  Fainting.  Pain or cramping in the abdomen. How is this diagnosed? This condition may be diagnosed based on:  A physical exam.  Your medical history.  Skin tests.  Blood tests.  A food challenge test. This test involves eating the food that may be causing the allergic response while being monitored for a reaction by your health care provider.  The results of an elimination diet. The elimination diet involves removing foods from your diet and then adding them back in, one at a time.  A food diary. How is this treated? There is no cure for food allergies. Treatment focuses on preventing exposure to the food or foods you are allergic to and treating reactions if you are exposed to the food. Mild symptoms may not need treatment.  Severe reactions usually need to be treated at a hospital. Treatment may include:  Medicines that  help: ? Tighten your blood vessels (epinephrine). ? Relieve itching and hives (antihistamines). ? Widen the narrow and tight airways (bronchodilators). ? Reduce swelling (corticosteroids).  Oxygen therapy to help you breathe.  IV fluids to keep you hydrated. After a severe reaction, you may be given rescue medicines, such as:  An anaphylaxis kit.  An epinephrine injection, commonly called an auto-injector "pen" (pre-filled automatic epinephrine injection device). Your health care provider may teach you how to use these if you are accidentally exposed to an allergen. Follow these instructions at home: If you have a potential allergy:  Follow the elimination diet as told by your health care provider.  Keep a food diary as told by your health care provider. Every day, write down: ? What you eat and drink and when. ? What symptoms you have and when. If you have a severe allergy:   Wear a medical alert bracelet or necklace that describes your allergy.  Carry your anaphylaxis kit or an auto-injector pen with you at all times. Use them as told by your health care provider.  Make sure that you, your family members, and your employer  know: ? The signs of anaphylaxis. ? How to use an anaphylaxis kit. ? How to use an auto-injector pen.  If you think that you are having an anaphylactic reaction, use your auto-injector pen or anaphylaxis kit.  Replace your auto-injector pen immediately after use, in case you have another reaction.  Get medical care after use your auto-injector pen. This is important because you can have a delayed, life-threatening reaction after taking the medicine (rebound anaphylaxis). General instructions  Avoid the foods that you are allergic to.  Read food labels before you eat packaged items. Look for ingredients that you are allergic to.  When you are at a restaurant, tell your server that you have an allergy. If you are unsure whether a meal has an ingredient  that you are allergic to, ask your server.  Take over-the-counter and prescription medicines only as told by your health care provider. ? Do not drive until the medicine has worn off, unless your health care provider gives you approval.  Inform all health care providers that you have a food allergy.  If you think that you might be allergic to something else, talk with your health care provider. Do not eat a food to see if you are allergic to it without talking with your health care provider first. Contact a health care provider if you:  Have symptoms that do not go away within 2 days.  Have symptoms that get worse.  Have new symptoms. Get help right away if you have symptoms of anaphylaxis:  Flushed skin.  Hives.  Swelling of the eyes, lips, face, mouth, tongue, or throat.  Difficulty breathing, speaking, or swallowing.  Wheezing.  Dizziness or light-headedness.  Fainting.  Pain or cramping in the abdomen. These symptoms may represent a serious problem that is an emergency. Do not wait to see if the symptoms will go away. Use your auto-injector pen or anaphylaxis kit as you have been told. Get medical help right away. Call your local emergency services (911 in the [place] Do not drive yourself to the hospital. If you needed to use an auto-injector pen, you need more medical care even if the medicine seems to be helping. This is important because anaphylaxis may happen again within 72 hours. Summary  A food allergy is an abnormal reaction to a food (food allergen) by the body's defense system (immune system).  There is no cure for food allergies. Treatment focuses on preventing exposure to the food or foods you are allergic to and treating reactions if you are exposed to the food.  Wear a medical alert bracelet or necklace that describes your allergy.  If you have symptoms of anaphylaxis, use your auto-injector pen or anaphylaxis kit as you have been instructed, and get  medical help right away. This information is not intended to replace advice given to you by your health care provider. Make sure you discuss any questions you have with your health care provider. Document Revised: 11/20/2017 Document Reviewed: 11/20/2017 Elsevier Patient Education  [place].  Anemia  Anemia is a condition in which you do not have enough red blood cells or hemoglobin. Hemoglobin is a substance in red blood cells that carries oxygen. When you do not have enough red blood cells or hemoglobin (are anemic), your body cannot get enough oxygen and your organs may not work properly. As a result, you may feel very tired or have other problems. What are the causes? Common causes of anemia include:  Excessive bleeding. Anemia can  be caused by excessive bleeding inside or outside the body, including bleeding from the intestine or from periods in women.  Poor nutrition.  Long-lasting (chronic) kidney, thyroid, and liver disease.  Bone marrow disorders.  Cancer and treatments for cancer.  HIV (human immunodeficiency virus) and AIDS (acquired immunodeficiency syndrome).  Treatments for HIV and AIDS.  Spleen problems.  Blood disorders.  Infections, medicines, and autoimmune disorders that destroy red blood cells. What are the signs or symptoms? Symptoms of this condition include:  Minor weakness.  Dizziness.  Headache.  Feeling heartbeats that are irregular or faster than normal (palpitations).  Shortness of breath, especially with exercise.  Paleness.  Cold sensitivity.  Indigestion.  Nausea.  Difficulty sleeping.  Difficulty concentrating. Symptoms may occur suddenly or develop slowly. If your anemia is mild, you may not have symptoms. How is this diagnosed? This condition is diagnosed based on:  Blood tests.  Your medical history.  A physical exam.  Bone marrow biopsy. Your health care provider may also check your stool (feces) for blood  and may do additional testing to look for the cause of your bleeding. You may also have other tests, including:  Imaging tests, such as a CT scan or MRI.  Endoscopy.  Colonoscopy. How is this treated? Treatment for this condition depends on the cause. If you continue to lose a lot of blood, you may need to be treated at a hospital. Treatment may include:  Taking supplements of iron, vitamin N62, or folic acid.  Taking a hormone medicine (erythropoietin) that can help to stimulate red blood cell growth.  Having a blood transfusion. This may be needed if you lose a lot of blood.  Making changes to your diet.  Having surgery to remove your spleen. Follow these instructions at home:  Take over-the-counter and prescription medicines only as told by your health care provider.  Take supplements only as told by your health care provider.  Follow any diet instructions that you were given.  Keep all follow-up visits as told by your health care provider. This is important. Contact a health care provider if:  You develop new bleeding anywhere in the body. Get help right away if:  You are very weak.  You are short of breath.  You have pain in your abdomen or chest.  You are dizzy or feel faint.  You have trouble concentrating.  You have bloody or black, tarry stools.  You vomit repeatedly or you vomit up blood. Summary  Anemia is a condition in which you do not have enough red blood cells or enough of a substance in your red blood cells that carries oxygen (hemoglobin).  Symptoms may occur suddenly or develop slowly.  If your anemia is mild, you may not have symptoms.  This condition is diagnosed with blood tests as well as a medical history and physical exam. Other tests may be needed.  Treatment for this condition depends on the cause of the anemia. This information is not intended to replace advice given to you by your health care provider. Make sure you discuss any  questions you have with your health care provider. Document Revised: 11/02/2017 Document Reviewed: 12/22/2016 Elsevier Patient Education  2020 [place] for [employer] Calories are units of energy. Your body needs a certain amount of calories from food to keep you going throughout the day. When you eat more calories than your body needs, your body stores the extra calories as fat. When you eat fewer calories  than your body needs, your body burns fat to get the energy it needs. Calorie counting means keeping track of how many calories you eat and drink each day. Calorie counting can be helpful if you need to lose weight. If you make sure to eat fewer calories than your body needs, you should lose weight. Ask your health care provider what a healthy weight is for you. For calorie counting to work, you will need to eat the right number of calories in a day in order to lose a healthy amount of weight per week. A dietitian can help you determine how many calories you need in a day and will give you suggestions on how to reach your calorie goal.  A healthy amount of weight to lose per week is usually 1-2 lb (0.5-0.9 kg). This usually means that your daily calorie intake should be reduced by 500-750 calories.  Eating 1,200 - 1,500 calories per day can help most women lose weight.  Eating 1,500 - 1,800 calories per day can help most men lose weight. What is my plan? My goal is to have __________ calories per day. If I have this many calories per day, I should lose around __________ pounds per week. What do I need to know about calorie counting? In order to meet your daily calorie goal, you will need to:  Find out how many calories are in each food you would like to eat. Try to do this before you eat.  Decide how much of the food you plan to eat.  Write down what you ate and how many calories it had. Doing this is called keeping a food log. To successfully lose weight, it is  important to balance calorie counting with a healthy lifestyle that includes regular activity. Aim for 150 minutes of moderate exercise (such as walking) or 75 minutes of vigorous exercise (such as running) each week. Where do I find calorie information?  The number of calories in a food can be found on a Nutrition Facts label. If a food does not have a Nutrition Facts label, try to look up the calories online or ask your dietitian for help. Remember that calories are listed per serving. If you choose to have more than one serving of a food, you will have to multiply the calories per serving by the amount of servings you plan to eat. For example, the label on a package of bread might say that a serving size is 1 slice and that there are 90 calories in a serving. If you eat 1 slice, you will have eaten 90 calories. If you eat 2 slices, you will have eaten 180 calories. How do I keep a food log? Immediately after each meal, record the following information in your food log:  What you ate. Don't forget to include toppings, sauces, and other extras on the food.  How much you ate. This can be measured in cups, ounces, or number of items.  How many calories each food and drink had.  The total number of calories in the meal. Keep your food log near you, such as in a small notebook in your pocket, or use a mobile app or website. Some programs will calculate calories for you and show you how many calories you have left for the day to meet your goal. What are some calorie counting tips?   Use your calories on foods and drinks that will fill you up and not leave you hungry: ? Some examples of foods  that fill you up are nuts and nut butters, vegetables, lean proteins, and high-fiber foods like whole grains. High-fiber foods are foods with more than 5 g fiber per serving. ? Drinks such as sodas, specialty coffee drinks, alcohol, and juices have a lot of calories, yet do not fill you up.  Eat nutritious  foods and avoid empty calories. Empty calories are calories you get from foods or beverages that do not have many vitamins or protein, such as candy, sweets, and soda. It is better to have a nutritious high-calorie food (such as an avocado) than a food with few nutrients (such as a bag of chips).  Know how many calories are in the foods you eat most often. This will help you calculate calorie counts faster.  Pay attention to calories in drinks. Low-calorie drinks include water and unsweetened drinks.  Pay attention to nutrition labels for "low fat" or "fat free" foods. These foods sometimes have the same amount of calories or more calories than the full fat versions. They also often have added sugar, starch, or salt, to make up for flavor that was removed with the fat.  Find a way of tracking calories that works for you. Get creative. Try different apps or programs if writing down calories does not work for you. What are some portion control tips?  Know how many calories are in a serving. This will help you know how many servings of a certain food you can have.  Use a measuring cup to measure serving sizes. You could also try weighing out portions on a kitchen scale. With time, you will be able to estimate serving sizes for some foods.  Take some time to put servings of different foods on your favorite plates, bowls, and cups so you know what a serving looks like.  Try not to eat straight from a bag or box. Doing this can lead to overeating. Put the amount you would like to eat in a cup or on a plate to make sure you are eating the right portion.  Use smaller plates, glasses, and bowls to prevent overeating.  Try not to multitask (for example, watch TV or use your computer) while eating. If it is time to eat, sit down at a table and enjoy your food. This will help you to know when you are full. It will also help you to be aware of what you are eating and how much you are eating. What are tips  for following this plan? Reading food labels  Check the calorie count compared to the serving size. The serving size may be smaller than what you are used to eating.  Check the source of the calories. Make sure the food you are eating is high in vitamins and protein and low in saturated and trans fats. Shopping  Read nutrition labels while you shop. This will help you make healthy decisions before you decide to purchase your food.  Make a grocery list and stick to it. Cooking  Try to cook your favorite foods in a healthier way. For example, try baking instead of frying.  Use low-fat dairy products. Meal planning  Use more fruits and vegetables. Half of your plate should be fruits and vegetables.  Include lean proteins like poultry and fish. How do I count calories when eating out?  Ask for smaller portion sizes.  Consider sharing an entree and sides instead of getting your own entree.  If you get your own entree, eat only half. Ask for  a box at the beginning of your meal and put the rest of your entree in it so you are not tempted to eat it.  If calories are listed on the menu, choose the lower calorie options.  Choose dishes that include vegetables, fruits, whole grains, low-fat dairy products, and lean protein.  Choose items that are boiled, broiled, grilled, or steamed. Stay away from items that are buttered, battered, fried, or served with cream sauce. Items labeled "crispy" are usually fried, unless stated otherwise.  Choose water, low-fat milk, unsweetened iced tea, or other drinks without added sugar. If you want an alcoholic beverage, choose a lower calorie option such as a glass of wine or light beer.  Ask for dressings, sauces, and syrups on the side. These are usually high in calories, so you should limit the amount you eat.  If you want a salad, choose a garden salad and ask for grilled meats. Avoid extra toppings like bacon, cheese, or fried items. Ask for the  dressing on the side, or ask for olive oil and vinegar or lemon to use as dressing.  Estimate how many servings of a food you are given. For example, a serving of cooked rice is  cup or about the size of half a baseball. Knowing serving sizes will help you be aware of how much food you are eating at restaurants. The list below tells you how big or small some common portion sizes are based on everyday objects: ? 1 oz--4 stacked dice. ? 3 oz--1 deck of cards. ? 1 tsp--1 die. ? 1 Tbsp-- a ping-pong ball. ? 2 Tbsp--1 ping-pong ball. ?  cup-- baseball. ? 1 cup--1 baseball. Summary  Calorie counting means keeping track of how many calories you eat and drink each day. If you eat fewer calories than your body needs, you should lose weight.  A healthy amount of weight to lose per week is usually 1-2 lb (0.5-0.9 kg). This usually means reducing your daily calorie intake by 500-750 calories.  The number of calories in a food can be found on a Nutrition Facts label. If a food does not have a Nutrition Facts label, try to look up the calories online or ask your dietitian for help.  Use your calories on foods and drinks that will fill you up, and not on foods and drinks that will leave you hungry.  Use smaller plates, glasses, and bowls to prevent overeating. This information is not intended to replace advice given to you by your health care provider. Make sure you discuss any questions you have with your health care provider. Document Revised: 08/09/2018 Document Reviewed: 10/20/2016 Elsevier Patient Education  [place].

## 2020-01-19 ENCOUNTER — Telehealth: Payer: Self-pay | Admitting: Family Medicine

## 2020-01-19 NOTE — Telephone Encounter (Signed)
Pt stated that she tried to go through the Pharmacy to fill her Iron medication (pt not sure what the medication is) Her pharmacy stated to her to check with her PCP to make sure the Iron medication is the correct one.  Pharmacy: [facility] [address] Fax: [phone]   Pt can be reached at [phone]

## 2020-01-19 NOTE — Telephone Encounter (Signed)
Spoke with pt pharmacy advised to dispense the available strength for Ferrous Sulfate 220 mL since the original 300 mL is not available

## 2020-01-19 NOTE — Telephone Encounter (Signed)
Pt pharmacy wants to know if ok to change pt Rx for Ferrous sulfate to 220 mL since they don't have the original Ferrous sulfate 300 mL(60 FE) in stock, please advise

## 2020-01-19 NOTE — Telephone Encounter (Signed)
That is fine 

## 2020-01-20 DIAGNOSIS — Z8616 Personal history of COVID-19: Secondary | ICD-10-CM | POA: Insufficient documentation

## 2020-02-05 ENCOUNTER — Ambulatory Visit: Payer: BC Managed Care – PPO | Admitting: Allergy

## 2020-02-27 ENCOUNTER — Encounter: Payer: Self-pay | Admitting: Allergy

## 2020-02-27 ENCOUNTER — Ambulatory Visit (INDEPENDENT_AMBULATORY_CARE_PROVIDER_SITE_OTHER): Payer: BC Managed Care – PPO | Admitting: Allergy

## 2020-02-27 ENCOUNTER — Other Ambulatory Visit: Payer: Self-pay

## 2020-02-27 VITALS — BP 112/80 | HR 83 | Temp 97.2°F | Resp 16 | Ht 65.0 in | Wt 198.6 lb

## 2020-02-27 DIAGNOSIS — J301 Allergic rhinitis due to pollen: Secondary | ICD-10-CM | POA: Diagnosis not present

## 2020-02-27 DIAGNOSIS — H1013 Acute atopic conjunctivitis, bilateral: Secondary | ICD-10-CM | POA: Diagnosis not present

## 2020-02-27 DIAGNOSIS — T7800XA Anaphylactic reaction due to unspecified food, initial encounter: Secondary | ICD-10-CM

## 2020-02-27 MED ORDER — EPINEPHRINE 0.3 MG/0.3ML IJ SOAJ: 0.3000 mg | Freq: Once | INTRAMUSCULAR | 2 refills | Status: AC

## 2020-02-27 NOTE — Progress Notes (Signed)
New Patient Note  RE: Terry Kennedy MRN: [id number] DOB: [birth year] Date of Office Visit: 02/27/2020  Referring provider: Billie Ruddy, MD Primary care provider: Billie Ruddy, MD  Chief Complaint: ?mushroom allergy  History of present illness: Terry Kennedy is a 29 y.o. female presenting today for consultation for food allergy.    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   She ate something that was meal prepped for her back in 2019.  She had it for lunch around 3pm.  She asked the chef not to use mushrooms.  However the meal was cooked with mushrooms but he took the cooked mushrooms out at the end.  The meal had squash, green peppers and other foods that she eats on a regular basis without issue.  No meat in the dish (she states she is not eating any meat in her diet including shellfish).    She states she did need to clear her throat more but that it was due to the food being spicy.  She also felt her stomach was a bit upset.  She woke up the next day and had hives on her face, legs.  She had lip swelling as well.  She denies any difficulty breathing, cough, wheezing, no nausea/vomiting.  She provided with pictures that were consistent with hives.  She did take benadryl to help with the hives.  The hives did resolve after that.    She states she may have tried mushrooms maybe a very long time ago.  She doesn't like mushrooms.   She reports having seasonal allergies with sneezing, stuffy nose and itchy nose, and itchy eyes/eyes hurting.  Symptoms occurring now. Spring, summer are worse.  She has used OTC eye drops and antihistamines which "kinda works."  No history of asthma, eczema.   Review of systems: Review of Systems  Constitutional: Negative.   HENT:       See HPI  Eyes: Negative.   Respiratory: Negative.   Cardiovascular: Negative.   Gastrointestinal: Negative.   Musculoskeletal: Negative.   Skin: Negative.   Neurological: Negative.      All other systems negative unless noted above in HPI  Past medical history: Past Medical History:  Diagnosis Date  . Normal pregnancy, first 07/15/2013  . SVD (spontaneous vaginal delivery) 07/16/2013    Past surgical history: Past Surgical History:  Procedure Laterality Date  . NO PAST SURGERIES      Family history:  Family History  Problem Relation Age of Onset  . Fibroids Other     Social history: Lives in an apartment with carpeting with electric heating.  No pets in the home but she is around dogs.  No concern for water damage, mildew or roaches in the home.  She is an Building control surveyor.  Denies smoking history.    Medication List: Current Outpatient Medications  Medication Sig Dispense Refill  . acetaminophen (TYLENOL) 500 MG tablet Take 500 mg by mouth every 6 (six) hours as needed (for pain.).    Marland Kitchen ferrous sulfate 300 (60 Fe) MG/5ML syrup Take 5 mLs (300 mg total) by mouth daily. 150 mL 3  . Vitamin D, Ergocalciferol, (DRISDOL) 1.25 MG (50000 UNIT) CAPS capsule Take 1 capsule (50,000 Units total) by mouth every 7 (seven) days. 12 capsule 0   No current facility-administered medications for this visit.    Known medication allergies: No Known Allergies   Physical examination: Blood pressure 112/80, pulse 83, temperature (!) 97.2 F (36.2 C),  temperature source Temporal, resp. rate 16, height 5\' 5"  (1.651 m), weight 198 lb 9.6 oz (90.1 kg), SpO2 99 %.  General: Alert, interactive, in no acute distress. HEENT: PERRLA, TMs pearly gray, turbinates minimally edematous without discharge, post-pharynx non erythematous. Neck: Supple without lymphadenopathy. Lungs: Clear to auscultation without wheezing, rhonchi or rales. {no increased work of breathing. CV: Normal S1, S2 without murmurs. Abdomen: Nondistended, nontender. Skin: Warm and dry, without lesions or rashes. Extremities:  No clubbing, cyanosis or edema. Neuro:   Grossly  intact.  Diagnositics/Labs: Allergy testing: environmental allergy skin prick testing is positive to ash, beech, pullulara.   Skin prick to mushroom is slightly reactive.  Allergy testing results were read and interpreted by provider, documented by clinical staff.   Assessment and plan: Anaphylaxis due to food  - skin testing to mushroom is slightly reactive  - continue avoidance of mushroom in diet  - have access to self-injectable epinephrine (Epipen or AuviQ) 0.3mg  at all times  - follow emergency action plan in case of allergic reaction  Allergic rhinitis with conjunctivitis - environmental allergy skin testing is positive to tree pollen and mold - allergen avoidance measures discussed/handouts provided - for generalized allergy symptom control can use over-the-counter Zyrtec 10mg , Allegra 180mg  or Xyzal 5 mg daily as needed - for itchy/watery/red eyes can use over-the-counter Pataday 1 drop each eye daily as needed - for nasal congestion/drainage can use over-the-counter Flonase, Rhinocort or Nasacort 2 sprays each nostril daily as needed.  Use for 1-2 weeks at a time before stopping once symptoms improve.    Follow-up 6-12 months or sooner if needed  I appreciate the opportunity to take part in [place] care. Please do not hesitate to contact me with questions.  Sincerely,   , MD Allergy/Immunology [facility]

## 2020-02-27 NOTE — Patient Instructions (Addendum)
Food allergy  - skin testing to mushroom is slightly reactive  - continue avoidance of mushroom in diet  - have access to self-injectable epinephrine (Epipen or AuviQ) 0.3mg  at all times  - follow emergency action plan in case of allergic reaction  Seasonal allergies - environmental allergy skin testing is positive to tree pollen and mold - allergen avoidance measures discussed/handouts provided - for generalized allergy symptom control can use over-the-counter Zyrtec 10mg , Allegra 180mg  or Xyzal 5 mg daily as needed - for itchy/watery/red eyes can use over-the-counter Pataday 1 drop each eye daily as needed - for nasal congestion/drainage can use over-the-counter Flonase, Rhinocort or Nasacort 2 sprays each nostril daily as needed.  Use for 1-2 weeks at a time before stopping once symptoms improve.    Follow-up 6-12 months or sooner if needed

## 2020-03-15 ENCOUNTER — Ambulatory Visit: Payer: BC Managed Care – PPO | Attending: Internal Medicine

## 2020-03-15 DIAGNOSIS — Z20822 Contact with and (suspected) exposure to covid-19: Secondary | ICD-10-CM

## 2020-03-16 LAB — NOVEL CORONAVIRUS, NAA: SARS-CoV-2, NAA: NOT DETECTED

## 2020-03-16 LAB — SARS-COV-2, NAA 2 DAY TAT

## 2020-07-23 ENCOUNTER — Other Ambulatory Visit: Payer: Self-pay

## 2020-07-23 ENCOUNTER — Other Ambulatory Visit: Payer: Medicaid Other

## 2020-07-23 DIAGNOSIS — Z20822 Contact with and (suspected) exposure to covid-19: Secondary | ICD-10-CM

## 2020-07-24 LAB — NOVEL CORONAVIRUS, NAA: SARS-CoV-2, NAA: NOT DETECTED

## 2020-07-24 LAB — SARS-COV-2, NAA 2 DAY TAT

## 2020-08-17 ENCOUNTER — Other Ambulatory Visit: Payer: Medicaid Other

## 2020-08-17 ENCOUNTER — Other Ambulatory Visit: Payer: Self-pay

## 2020-08-17 DIAGNOSIS — Z20822 Contact with and (suspected) exposure to covid-19: Secondary | ICD-10-CM

## 2020-08-19 LAB — SARS-COV-2, NAA 2 DAY TAT

## 2020-08-19 LAB — NOVEL CORONAVIRUS, NAA: SARS-CoV-2, NAA: NOT DETECTED

## 2020-08-23 ENCOUNTER — Encounter: Payer: Self-pay | Admitting: Family Medicine

## 2020-08-23 ENCOUNTER — Telehealth (INDEPENDENT_AMBULATORY_CARE_PROVIDER_SITE_OTHER): Payer: Self-pay | Admitting: Family Medicine

## 2020-08-23 VITALS — Ht 65.0 in | Wt 190.0 lb

## 2020-08-23 DIAGNOSIS — Z862 Personal history of diseases of the blood and blood-forming organs and certain disorders involving the immune mechanism: Secondary | ICD-10-CM

## 2020-08-23 DIAGNOSIS — J0110 Acute frontal sinusitis, unspecified: Secondary | ICD-10-CM

## 2020-08-23 DIAGNOSIS — E559 Vitamin D deficiency, unspecified: Secondary | ICD-10-CM

## 2020-08-23 MED ORDER — FERROUS SULFATE 300 (60 FE) MG/5ML PO SYRP: 300.0000 mg | ORAL_SOLUTION | Freq: Every day | ORAL | 3 refills | Status: DC

## 2020-08-23 MED ORDER — VITAMIN D (ERGOCALCIFEROL) 1.25 MG (50000 UNIT) PO CAPS: 50000.0000 [IU] | ORAL_CAPSULE | ORAL | 1 refills | Status: DC

## 2020-08-23 MED ORDER — AMOXICILLIN 875 MG PO TABS: 875.0000 mg | ORAL_TABLET | Freq: Two times a day (BID) | ORAL | 0 refills | Status: DC

## 2020-08-23 NOTE — Progress Notes (Signed)
Patient ID: Terry Kennedy, female   DOB: July 29, 1991, 29 y.o.   MRN: [id number]  This visit type was conducted due to national recommendations for restrictions regarding the COVID-19 pandemic in an effort to limit this patient's exposure and mitigate transmission in our community.   Virtual Visit via Video Note  I connected with [name] on 08/23/20 at  4:15 PM EDT by a video enabled telemedicine application and verified that I am speaking with the correct person using two identifiers.  Location patient: home Location provider:work or home office Persons participating in the virtual visit: patient, provider  I discussed the limitations of evaluation and management by telemedicine and the availability of in person appointments. The patient expressed understanding and agreed to proceed.   HPI:  Terry Kennedy called with over 1 month history of some cough, intermittent headaches, nasal congestion, and yellowish nasal discharge.  She has had some frontal sinus pressure.  She went for Covid testing last week which came back negative.  She has not had any fever.  No dyspnea.  No nausea, vomiting, or diarrhea.  She actually had Covid last year but felt fully recovered from that.  She has taken some over-the-counter medications without much relief.  ROS: See pertinent positives and negatives per HPI.  Past Medical History:  Diagnosis Date  . Normal pregnancy, first 07/15/2013  . SVD (spontaneous vaginal delivery) 07/16/2013    Past Surgical History:  Procedure Laterality Date  . NO PAST SURGERIES      Family History  Problem Relation Age of Onset  . Fibroids Other     SOCIAL HX: Non-smoker   Current Outpatient Medications:  .  acetaminophen (TYLENOL) 500 MG tablet, Take 500 mg by mouth every 6 (six) hours as needed (for pain.)., Disp: , Rfl:  .  ferrous sulfate 300 (60 Fe) MG/5ML syrup, Take 5 mLs (300 mg total) by mouth daily., Disp: 150 mL, Rfl: 3 .  Vitamin D, Ergocalciferol,  (DRISDOL) 1.25 MG (50000 UNIT) CAPS capsule, Take 1 capsule (50,000 Units total) by mouth every 7 (seven) days., Disp: 12 capsule, Rfl: 1 .  amoxicillin (AMOXIL) 875 MG tablet, Take 1 tablet (875 mg total) by mouth 2 (two) times daily., Disp: 20 tablet, Rfl: 0  EXAM:  VITALS per patient if applicable:  GENERAL: alert, oriented, appears well and in no acute distress  HEENT: atraumatic, conjunttiva clear, no obvious abnormalities on inspection of external nose and ears  NECK: normal movements of the head and neck  LUNGS: on inspection no signs of respiratory distress, breathing rate appears normal, no obvious gross SOB, gasping or wheezing  CV: no obvious cyanosis  MS: moves all visible extremities without noticeable abnormality  PSYCH/NEURO: pleasant and cooperative, no obvious depression or anxiety, speech and thought processing grossly intact  ASSESSMENT AND PLAN:  Discussed the following assessment and plan:  Probable acute frontal sinusitis.  Patient has had over 1 month history of symptoms with negative Covid testing.  -Given duration of symptoms start amoxicillin 875 mg twice daily with food -Recommend over-the-counter Mucinex twice daily -Follow-up with Dr. Salomon Fick if not improving over the next week -Patient also requested refills of her vitamin D and iron supplement and these were provided     I discussed the assessment and treatment plan with the patient. The patient was provided an opportunity to ask questions and all were answered. The patient agreed with the plan and demonstrated an understanding of the instructions.   The patient was advised to call back  or seek an in-person evaluation if the symptoms worsen or if the condition fails to improve as anticipated.     Evelena Peat, MD

## 2020-08-27 ENCOUNTER — Telehealth: Payer: Self-pay

## 2020-08-27 NOTE — Telephone Encounter (Signed)
Called pt left message to call the office regarding her last video visit concerns

## 2020-09-02 ENCOUNTER — Ambulatory Visit: Payer: BC Managed Care – PPO | Admitting: Allergy

## 2020-09-20 ENCOUNTER — Encounter: Payer: Self-pay | Admitting: Allergy

## 2020-09-20 ENCOUNTER — Other Ambulatory Visit: Payer: Self-pay

## 2020-09-20 ENCOUNTER — Ambulatory Visit (INDEPENDENT_AMBULATORY_CARE_PROVIDER_SITE_OTHER): Payer: BC Managed Care – PPO | Admitting: Allergy

## 2020-09-20 VITALS — BP 108/62 | HR 90 | Temp 97.6°F | Resp 14 | Ht 63.75 in | Wt 206.2 lb

## 2020-09-20 DIAGNOSIS — T7800XA Anaphylactic reaction due to unspecified food, initial encounter: Secondary | ICD-10-CM

## 2020-09-20 DIAGNOSIS — H1013 Acute atopic conjunctivitis, bilateral: Secondary | ICD-10-CM | POA: Diagnosis not present

## 2020-09-20 DIAGNOSIS — L243 Irritant contact dermatitis due to cosmetics: Secondary | ICD-10-CM | POA: Diagnosis not present

## 2020-09-20 DIAGNOSIS — J301 Allergic rhinitis due to pollen: Secondary | ICD-10-CM | POA: Diagnosis not present

## 2020-09-20 DIAGNOSIS — T7800XD Anaphylactic reaction due to unspecified food, subsequent encounter: Secondary | ICD-10-CM

## 2020-09-20 NOTE — Progress Notes (Signed)
Follow-up Note  RE: Terry Kennedy MRN: [id number] DOB: 23-Aug-1991 Date of Office Visit: 09/20/2020   History of present illness: Terry Kennedy is a 29 y.o. female presenting today for follow-up of mushroom allergy with new complaint of lip peeling. History and physical obtained by Dr. Beau Fanny medicine resident, and reviewed/edited by myself.   She was last seen 02/27/20 when she was found to have slight reactivity on skin testing of mushroom. She has since continued to avoid mushrooms without any recurence of symptoms. She was also found to have positive skin allergen testing to tree pollen and mold. She was instructed to start taking a daily OTC antihistamine as needed and Pataday eye drops.  She does note post-nasal drip as well as feeling bilateral ear drainage. She states she had seen a doctor and was told she could have a sinus infection and was prescribed Amoxicillin twice daily, although did not complete her course of antibiotics as prescribed as she prefers not to take daily pills. She denies any fevers, chills, sinus pressure, ear pain, or ear fullness.  With her lip peeling she states she was using a coconut oil lip product and states every time she used it it would make her lips dry, turn a whitish color and then she was able to peel off dry skin from the lips.  She stopped using the product.  She is using blistex which doesn't cause any issues.  She still carries around the coconut product.  No reported issues with other coconut food or body products in the past that she is aware of.    Review of systems:  All other systems negative unless noted above in HPI  Past medical/social/surgical/family history have been reviewed and are unchanged unless specifically indicated below.  No changes  Medication List: Current Outpatient Medications  Medication Sig Dispense Refill  . acetaminophen (TYLENOL) 500 MG tablet Take 500 mg by mouth every 6 (six) hours as  needed (for pain.).    Marland Kitchen ferrous sulfate 300 (60 Fe) MG/5ML syrup Take 5 mLs (300 mg total) by mouth daily. 150 mL 3  . Vitamin D, Ergocalciferol, (DRISDOL) 1.25 MG (50000 UNIT) CAPS capsule Take 1 capsule (50,000 Units total) by mouth every 7 (seven) days. 12 capsule 1   No current facility-administered medications for this visit.     Known medication allergies: Allergies  Allergen Reactions  . Mushroom Extract Complex Hives     Physical examination: Blood pressure 108/62, pulse 90, temperature 97.6 F (36.4 C), resp. rate 14, height 5' 3.75" (1.619 m), weight 206 lb 3.2 oz (93.5 kg), SpO2 99 %.  General: Alert, interactive, in no acute distress. HEENT: TMs pearly gray, turbinates mildly edematous without discharge, post-pharynx non erythematous. Lips are normal in color without dryness, cracking, or other lesions. Moist mucus membranes. Neck: Supple without lymphadenopathy. Lungs: Clear to auscultation without wheezing, rhonchi or rales. {no increased work of breathing. CV: Normal S1, S2 without murmurs. Abdomen: Nondistended, nontender. Skin: Warm and dry, without lesions or rashes. Extremities:  No clubbing, cyanosis or edema. Neuro:   Grossly intact.  Assessment and plan:   Lip Peeling/contact dermatitis: - Please stop using your coconut lip balm - Discussed this represents contact dermatitis to this lip product and discussed option of patch testing to see if can determine ingredient(s) you are reactive too - Please hold on to this balm as can perform patch testing directly to this product - Can continue to use Blistex and other products you  have tolerated without issue as needed for dry lips  - Call to schedule an appointment sooner if you lip symptoms continue or worsen.   Allergic Rhinitis: - continue avoidance measures for tree pollen and mold - Continue to use over the counter Zyrtec 10mg , Allegra 180mg , or Xyzal 5mg  daily as needed - Continue to use over the counter  Pataday 1 drop each eye daily as needed - trial dymista 1 spray each nostril twice a day.  This is a combination nasal spray with Flonase + Astelin (nasal antihistamine).  This helps with both nasal congestion and drainage.  Food allergy:  - continue avoidance of mushrooms in diet  - continue avoidance of   - have access to self-injectable epinephrine (Epipen or AuviQ) 0.3mg  at all times  - follow emergency action plan in case of allergic reaction     Please call to schedule a return visit in 4-6 months or if symptoms continue to worsen despite the above changes.  , MD [facility] medicine resident  Attestation: I appreciate the opportunity to take part in [facility] care. Please do not hesitate to contact me with questions.  I performed a history and physical examination of the patient and discussed management with the resident. I reviewed the resident's note and agree with the documented findings and plan of care. The note in its entirety was edited by myself, including the physical exam, assessment, and plan.   , MD [facility]

## 2020-09-20 NOTE — Patient Instructions (Addendum)
Lip Peeling: - Please stop using your coconut lip balm - Discussed this represents contact dermatitis to this lip product and discussed option of patch testing to see if can determine ingredient(s) you are reactive too - Please hold on to this balm as can perform patch testing directly to this product - Can continue to use Blistex and other products you have tolerated without issue as needed for dry lips  - Call to schedule an appointment sooner if you lip symptoms continue or worsen.   Allergic Rhinitis: - continue avoidance measures for tree pollen and mold - Continue to use over the counter Zyrtec 10mg , Allegra 180mg , or Xyzal 5mg  daily as needed - Continue to use over the counter Pataday 1 drop each eye daily as needed - trial dymista 1 spray each nostril twice a day.  This is a combination nasal spray with Flonase + Astelin (nasal antihistamine).  This helps with both nasal congestion and drainage.  Food allergy:  - continue avoidance of mushrooms in diet  - continue avoidance of   - have access to self-injectable epinephrine (Epipen or AuviQ) 0.3mg  at all times  - follow emergency action plan in case of allergic reaction     Please call to schedule a return visit in 4-6 months or if symptoms continue to worsen despite the above changes.

## 2020-09-21 ENCOUNTER — Other Ambulatory Visit: Payer: Self-pay

## 2020-09-21 MED ORDER — AUVI-Q 0.3 MG/0.3ML IJ SOAJ: 0.3000 mg | Freq: Once | INTRAMUSCULAR | 2 refills | Status: DC

## 2020-09-21 MED ORDER — AZELASTINE-FLUTICASONE 137-50 MCG/ACT NA SUSP: 2.0000 | NASAL | 5 refills | Status: AC

## 2020-09-27 MED ORDER — AUVI-Q 0.3 MG/0.3ML IJ SOAJ: 0.3000 mg | Freq: Once | INTRAMUSCULAR | 1 refills | Status: AC | PRN

## 2020-09-27 NOTE — Addendum Note (Signed)
Addended by: Mliss Fritz I on: 09/27/2020 01:23 PM   Modules accepted: Orders

## 2020-10-14 ENCOUNTER — Other Ambulatory Visit: Payer: BC Managed Care – PPO

## 2020-10-14 DIAGNOSIS — Z20822 Contact with and (suspected) exposure to covid-19: Secondary | ICD-10-CM

## 2020-10-16 LAB — NOVEL CORONAVIRUS, NAA: SARS-CoV-2, NAA: NOT DETECTED

## 2020-10-16 LAB — SARS-COV-2, NAA 2 DAY TAT

## 2020-12-16 ENCOUNTER — Other Ambulatory Visit: Payer: BC Managed Care – PPO

## 2020-12-21 ENCOUNTER — Other Ambulatory Visit: Payer: Self-pay

## 2020-12-22 ENCOUNTER — Ambulatory Visit: Payer: Medicaid Other | Admitting: Family Medicine

## 2020-12-22 ENCOUNTER — Telehealth: Payer: Self-pay | Admitting: Family Medicine

## 2020-12-22 ENCOUNTER — Encounter: Payer: Self-pay | Admitting: Family Medicine

## 2020-12-22 VITALS — BP 108/78 | HR 85 | Temp 98.3°F | Wt 210.2 lb

## 2020-12-22 DIAGNOSIS — E049 Nontoxic goiter, unspecified: Secondary | ICD-10-CM | POA: Diagnosis not present

## 2020-12-22 DIAGNOSIS — Q831 Accessory breast: Secondary | ICD-10-CM | POA: Diagnosis not present

## 2020-12-22 DIAGNOSIS — Z711 Person with feared health complaint in whom no diagnosis is made: Secondary | ICD-10-CM

## 2020-12-22 DIAGNOSIS — R0789 Other chest pain: Secondary | ICD-10-CM | POA: Diagnosis not present

## 2020-12-22 DIAGNOSIS — R635 Abnormal weight gain: Secondary | ICD-10-CM

## 2020-12-22 DIAGNOSIS — M62838 Other muscle spasm: Secondary | ICD-10-CM | POA: Diagnosis not present

## 2020-12-22 LAB — CBC WITH DIFFERENTIAL/PLATELET
Basophils Absolute: 0 10*3/uL (ref 0.0–0.1)
Basophils Relative: 0.8 % (ref 0.0–3.0)
Eosinophils Absolute: 0.1 10*3/uL (ref 0.0–0.7)
Eosinophils Relative: 1.6 % (ref 0.0–5.0)
HCT: 34.8 % — ABNORMAL LOW (ref 36.0–46.0)
Hemoglobin: 11.6 g/dL — ABNORMAL LOW (ref 12.0–15.0)
Lymphocytes Relative: 44.3 % (ref 12.0–46.0)
Lymphs Abs: 2.3 10*3/uL (ref 0.7–4.0)
MCHC: 33.2 g/dL (ref 30.0–36.0)
MCV: 81.6 fl (ref 78.0–100.0)
Monocytes Absolute: 0.4 10*3/uL (ref 0.1–1.0)
Monocytes Relative: 7.1 % (ref 3.0–12.0)
Neutro Abs: 2.4 10*3/uL (ref 1.4–7.7)
Neutrophils Relative %: 46.2 % (ref 43.0–77.0)
Platelets: 297 10*3/uL (ref 150.0–400.0)
RBC: 4.27 Mil/uL (ref 3.87–5.11)
RDW: 14.8 % (ref 11.5–15.5)
WBC: 5.1 10*3/uL (ref 4.0–10.5)

## 2020-12-22 LAB — BASIC METABOLIC PANEL WITH GFR
BUN: 7 mg/dL (ref 6–23)
CO2: 30 meq/L (ref 19–32)
Calcium: 9.8 mg/dL (ref 8.4–10.5)
Chloride: 104 meq/L (ref 96–112)
Creatinine, Ser: 0.81 mg/dL (ref 0.40–1.20)
GFR: 97.05 mL/min
Glucose, Bld: 91 mg/dL (ref 70–99)
Potassium: 4.1 meq/L (ref 3.5–5.1)
Sodium: 139 meq/L (ref 135–145)

## 2020-12-22 LAB — TSH: TSH: 1.39 u[IU]/mL (ref 0.35–4.50)

## 2020-12-22 LAB — HEMOGLOBIN A1C: Hgb A1c MFr Bld: 5.7 % (ref 4.6–6.5)

## 2020-12-22 LAB — T4, FREE: Free T4: 0.75 ng/dL (ref 0.60–1.60)

## 2020-12-22 MED ORDER — CYCLOBENZAPRINE HCL 5 MG PO TABS: 5.0000 mg | ORAL_TABLET | Freq: Every evening | ORAL | 0 refills | Status: DC | PRN

## 2020-12-22 NOTE — Progress Notes (Signed)
Subjective:    Patient ID: Terry Kennedy, female    DOB: [birth year], 30 y.o.   MRN: [id number]  No chief complaint on file.   HPI Patient was seen today for acute concern.  Patient endorses right-sided mid back pain/tightness x5 days, muscle spasms in back and right side of upper chest with pressure in right breast x3 days.  Patient denies nipple discharge, nipple inversion, skin changes.  LMP 1/1-12/09/20.  Menses regular s/p EAB in Nov. Tried Tylenol extra strength. Pt notes increased stress, 3 panic attacks since Nov.  Does deep breathing.  Starting to turn off her phone at times.  Considering counseling.  Patient is a Runner, broadcasting/film/video.  Pt endorses COVID-19 infection in the beginning of January.  Pt notes weight gain.  Trying to eat better, cut down on snacks, eating a pescatarian diet.  Past Medical History:  Diagnosis Date  . Normal pregnancy, first 07/15/2013  . SVD (spontaneous vaginal delivery) 07/16/2013    Allergies  Allergen Reactions  . Mushroom Extract Complex Hives    ROS General: Denies fever, chills, night sweats, changes in weight, changes in appetite HEENT: Denies headaches, ear pain, changes in vision, rhinorrhea, sore throat CV: Denies CP, palpitations, SOB, orthopnea Pulm: Denies SOB, cough, wheezing GI: Denies abdominal pain, nausea, vomiting, diarrhea, constipation GU: Denies dysuria, hematuria, frequency, vaginal discharge  + right breast pressure Msk: Denies muscle cramps, joint pains  + right mid back tightness/spasm Neuro: Denies weakness, numbness, tingling Skin: Denies rashes, bruising Psych: Denies depression, anxiety, hallucinations     Objective:    Blood pressure 108/78, pulse 85, temperature 98.3 F (36.8 C), temperature source Oral, weight 210 lb 3.2 oz (95.3 kg), SpO2 98 %.   Gen. Pleasant, well-nourished, in no distress, normal affect   HEENT: [place]/AT, face symmetric, conjunctiva clear, no scleral icterus, PERRLA, EOMI, nares patent without  drainage.  Mild thyromegaly right lobe> left lobe Lungs: no accessory muscle use, CTAB, no wheezes or rales Cardiovascular: RRR, no m/r/g, no peripheral edema GU: Large pendulous breast, accessory breast tissue in b/l axilla with L side firmer than R.  Hyper dense breast tissue appreciated bilaterally.  No erythema, peau to orange, nipple retraction, nipple discharge. Abdomen: BS present, soft, NT/ND, no hepatosplenomegaly. Musculoskeletal: TTP of right mid thoracic spine along inferior medial border of R  Scapula.no TTP of chest wall.  No deformities, no cyanosis or clubbing, normal tone Neuro:  A&Ox3, CN II-XII intact, normal gait Skin:  Warm, no lesions/ rash.  No indentions of bilateral shoulders where bra strap sits.   Wt Readings from Last 3 Encounters:  12/22/20 210 lb 3.2 oz (95.3 kg)  09/20/20 206 lb 3.2 oz (93.5 kg)  08/23/20 190 lb (86.2 kg)    Lab Results  Component Value Date   WBC 4.6 01/15/2020   HGB 11.2 (L) 01/15/2020   HCT 34.7 (L) 01/15/2020   PLT 338.0 01/15/2020   GLUCOSE 154 (H) 10/29/2015   ALT 9 (L) 10/29/2015   AST 16 10/29/2015   NA 138 10/29/2015   K 3.6 10/29/2015   CL 108 10/29/2015   CREATININE 0.71 10/29/2015   BUN 6 10/29/2015   CO2 24 10/29/2015   TSH 1.42 01/15/2020    Assessment/Plan:  Muscle spasm  - Plan: cyclobenzaprine (FLEXERIL) 5 MG tablet, Basic metabolic panel  Chest wall pain -Likely 2/2 muscle spasm.  Increased stress and anxiety likely contributing -Discussed self-care, stretching, heat, massage, and other supportive care -We will start Flexeril nightly as needed - Plan:  Basic metabolic panel, Basic metabolic panel  Accessory breast tissue of axilla -Left axillary accessory mammary tissue firmer than right sided axillary accessory mammary tissue -Discussed considering ultrasound - Plan: Basic metabolic panel  Goiter  - Plan: TSH, T4, free, Basic metabolic panel, Basic metabolic panel, T4, free, TSH  Weight gain  -  Plan: TSH, T4, free, CBC with Differential/Platelet, Hemoglobin A1c, Basic metabolic panel  Concerned about female breast disease without diagnosis -For continued sensation of pressure/discomfort will obtain breast ultrasound  F/u as needed in 1 month, sooner if needed  Abbe Amsterdam, MD

## 2020-12-22 NOTE — Telephone Encounter (Signed)
Patient called back and stated that she was seen today and was given a muscle relaxer and wanted to see if she can be give a stronger pain medication instead, please advise. CB is [phone]

## 2020-12-22 NOTE — Patient Instructions (Addendum)
Your blood type is O+.  It was cheched 01/02/2013 when you were pregnant.  Counseling: [facility]   Muscle Cramps and Spasms Muscle cramps and spasms are when muscles tighten by themselves. They usually get better within minutes. Muscle cramps are painful. They are usually stronger and last longer than muscle spasms. Muscle spasms may or may not be painful. They can last a few seconds or much longer. Cramps and spasms can affect any muscle, but they occur most often in the calf muscles of the leg. They are usually not caused by a serious problem. In many cases, the cause is not known. Some common causes include:  Doing more physical work or exercise than your body is ready for.  Using the muscles too much (overuse) by repeating certain movements too many times.  Staying in a certain position for a long time.  Playing a sport or doing an activity without preparing properly.  Using bad form or technique while playing a sport or doing an activity.  Not having enough water in your body (dehydration).  Injury.  Side effects of some medicines.  Low levels of the salts and minerals in your blood (electrolytes), such as low potassium or calcium. Follow these instructions at home: Managing pain and stiffness  Massage, stretch, and relax the muscle. Do this for many minutes at a time.  If told, put heat on tight or tense muscles as often as told by your doctor. Use the heat source that your doctor recommends, such as a moist heat pack or a heating pad. ? Place a towel between your skin and the heat source. ? Leave the heat on for 20-30 minutes. ? Remove the heat if your skin turns bright red. This is very important if you are not able to feel pain, heat, or cold. You may have a greater risk of getting burned.  If told, put ice on the affected area. This may help if you are sore or have pain after a cramp or spasm. ? Put ice in a plastic bag. ? Place a towel  between your skin and the bag. ? Leave the ice on for 20 minutes, 2-3 times a day.  Try taking hot showers or baths to help relax tight muscles.      Eating and drinking  Drink enough fluid to keep your pee (urine) pale yellow.  Eat a healthy diet to help ensure that your muscles work well. This should include: ? Fruits and vegetables. ? Lean protein. ? Whole grains. ? Low-fat or nonfat dairy products. General instructions  If you are having cramps often, avoid intense exercise for several days.  Take over-the-counter and prescription medicines only as told by your doctor.  Watch for any changes in your symptoms.  Keep all follow-up visits as told by your doctor. This is important. Contact a doctor if:  Your cramps or spasms get worse or happen more often.  Your cramps or spasms do not get better with time. Summary  Muscle cramps and spasms are when muscles tighten by themselves. They usually get better within minutes.  Cramps and spasms occur most often in the calf muscles of the leg.  Massage, stretch, and relax the muscle. This may help the cramp or spasm go away.  Drink enough fluid to keep your pee (urine) pale yellow. This information is not intended to replace advice given to you by your health care provider. Make sure you discuss any questions you have with your health care provider.  Document Revised: 04/15/2018 Document Reviewed: 04/15/2018 Elsevier Patient Education  2021 [employer].  Nonspecific Chest Pain Chest pain can be caused by many different conditions. Some causes of chest pain can be life-threatening. These will require treatment right away. Serious causes of chest pain include:  Heart attack.  A tear in the body's main blood vessel.  Redness and swelling (inflammation) around your heart.  Blood clot in your lungs. Other causes of chest pain may not be so serious. These include:  Heartburn.  Anxiety or stress.  Damage to bones or  muscles in your chest.  Lung infections. Chest pain can feel like:  Pain or discomfort in your chest.  Crushing, pressure, aching, or squeezing pain.  Burning or tingling.  Dull or sharp pain that is worse when you move, cough, or take a deep breath.  Pain or discomfort that is also felt in your back, neck, jaw, shoulder, or arm, or pain that spreads to any of these areas. It is hard to know whether your pain is caused by something that is serious or something that is not so serious. So it is important to see your doctor right away if you have chest pain. Follow these instructions at home: Medicines  Take over-the-counter and prescription medicines only as told by your doctor.  If you were prescribed an antibiotic medicine, take it as told by your doctor. Do not stop taking the antibiotic even if you start to feel better. Lifestyle  Rest as told by your doctor.  Do not use any products that contain nicotine or tobacco, such as cigarettes, e-cigarettes, and chewing tobacco. If you need help quitting, ask your doctor.  Do not drink alcohol.  Make lifestyle changes as told by your doctor. These may include: ? Getting regular exercise. Ask your doctor what activities are safe for you. ? Eating a heart-healthy diet. A diet and nutrition specialist (dietitian) can help you to learn healthy eating options. ? Staying at a healthy weight. ? Treating diabetes or high blood pressure, if needed. ? Lowering your stress. Activities such as yoga and relaxation techniques can help.   General instructions  Pay attention to any changes in your symptoms. Tell your doctor about them or any new symptoms.  Avoid any activities that cause chest pain.  Keep all follow-up visits as told by your doctor. This is important. You may need more testing if your chest pain does not go away. Contact a doctor if:  Your chest pain does not go away.  You feel depressed.  You have a fever. Get help right  away if:  Your chest pain is worse.  You have a cough that gets worse, or you cough up blood.  You have very bad (severe) pain in your belly (abdomen).  You pass out (faint).  You have either of these for no clear reason: ? Sudden chest discomfort. ? Sudden discomfort in your arms, back, neck, or jaw.  You have shortness of breath at any time.  You suddenly start to sweat, or your skin gets clammy.  You feel sick to your stomach (nauseous).  You throw up (vomit).  You suddenly feel lightheaded or dizzy.  You feel very weak or tired.  Your heart starts to beat fast, or it feels like it is skipping beats. These symptoms may be an emergency. Do not wait to see if the symptoms will go away. Get medical help right away. Call your local emergency services (911 in the [place] Do not drive  yourself to the hospital. Summary  Chest pain can be caused by many different conditions. The cause may be serious and need treatment right away. If you have chest pain, see your doctor right away.  Follow your doctor's instructions for taking medicines and making lifestyle changes.  Keep all follow-up visits as told by your doctor. This includes visits for any further testing if your chest pain does not go away.  Be sure to know the signs that show that your condition has become worse. Get help right away if you have these symptoms. This information is not intended to replace advice given to you by your health care provider. Make sure you discuss any questions you have with your health care provider. Document Revised: 05/23/2018 Document Reviewed: 05/23/2018 Elsevier Patient Education  2021 [employer] Inc.  Textbook of family medicine (9th ed., pp. 1062-1073). [place], [place]: [employer].">  Stress, Adult Stress is a normal reaction to life events. Stress is what you feel when life demands more than you are used to, or more than you think you can handle. Some stress can be useful, such as studying  for a test or meeting a deadline at work. Stress that occurs too often or for too long can cause problems. It can affect your emotional health and interfere with relationships and normal daily activities. Too much stress can weaken your body's defense system (immune system) and increase your risk for physical illness. If you already have a medical problem, stress can make it worse. What are the causes? All sorts of life events can cause stress. An event that causes stress for one person may not be stressful for another person. Major life events, whether positive or negative, commonly cause stress. Examples include:  Losing a job or starting a new job.  Losing a loved one.  Moving to a new town or home.  Getting married or divorced.  Having a baby.  Getting injured or sick. Less obvious life events can also cause stress, especially if they occur day after day or in combination with each other. Examples include:  Working long hours.  Driving in traffic.  Caring for children.  Being in debt.  Being in a difficult relationship. What are the signs or symptoms? Stress can cause emotional symptoms, including:  Anxiety. This is feeling worried, afraid, on edge, overwhelmed, or out of control.  Anger, including irritation or impatience.  Depression. This is feeling sad, down, helpless, or guilty.  Trouble focusing, remembering, or making decisions. Stress can cause physical symptoms, including:  Aches and pains. These may affect your head, neck, back, stomach, or other areas of your body.  Tight muscles or a clenched jaw.  Low energy.  Trouble sleeping. Stress can cause unhealthy behaviors, including:  Eating to feel better (overeating) or skipping meals.  Working too much or putting off tasks.  Smoking, drinking alcohol, or using drugs to feel better. How is this diagnosed? Stress is diagnosed through an assessment by your health care provider. He or she may diagnose this  condition based on:  Your symptoms and any stressful life events.  Your medical history.  Tests to rule out other causes of your symptoms. Depending on your condition, your health care provider may refer you to a specialist for further evaluation. How is this treated? Stress management techniques are the recommended treatment for stress. Medicine is not typically recommended for the treatment of stress. Techniques to reduce your reaction to stressful life events include:  Stress identification. Monitor yourself for symptoms   of stress and identify what causes stress for you. These skills may help you to avoid or prepare for stressful events.  Time management. Set your priorities, keep a calendar of events, and learn to say no. Taking these actions can help you avoid making too many commitments. Techniques for coping with stress include:  Rethinking the problem. Try to think realistically about stressful events rather than ignoring them or overreacting. Try to find the positives in a stressful situation rather than focusing on the negatives.  Exercise. Physical exercise can release both physical and emotional tension. The key is to find a form of exercise that you enjoy and do it regularly.  Relaxation techniques. These relax the body and mind. The key is to find one or more that you enjoy and use the techniques regularly. Examples include: ? Meditation, deep breathing, or progressive relaxation techniques. ? Yoga or tai chi. ? Biofeedback, mindfulness techniques, or journaling. ? Listening to music, being out in nature, or participating in other hobbies.  Practicing a healthy lifestyle. Eat a balanced diet, drink plenty of water, limit or avoid caffeine, and get plenty of sleep.  Having a strong support network. Spend time with family, friends, or other people you enjoy being around. Express your feelings and talk things over with someone you trust. Counseling or talk therapy with a  mental health professional may be helpful if you are having trouble managing stress on your own.   Follow these instructions at home: Lifestyle  Avoid drugs.  Do not use any products that contain nicotine or tobacco, such as cigarettes, e-cigarettes, and chewing tobacco. If you need help quitting, ask your health care provider.  Limit alcohol intake to no more than 1 drink a day for nonpregnant women and 2 drinks a day for men. One drink equals 12 oz of beer, 5 oz of wine, or 1 oz of hard liquor  Do not use alcohol or drugs to relax.  Eat a balanced diet that includes fresh fruits and vegetables, whole grains, lean meats, fish, eggs, and beans, and low-fat dairy. Avoid processed foods and foods high in added fat, sugar, and salt.  Exercise at least 30 minutes on 5 or more days each week.  Get 7-8 hours of sleep each night.   General instructions  Practice stress management techniques as discussed with your health care provider.  Drink enough fluid to keep your urine clear or pale yellow.  Take over-the-counter and prescription medicines only as told by your health care provider.  Keep all follow-up visits as told by your health care provider. This is important.   Contact a health care provider if:  Your symptoms get worse.  You have new symptoms.  You feel overwhelmed by your problems and can no longer manage them on your own. Get help right away if:  You have thoughts of hurting yourself or others. If you ever feel like you may hurt yourself or others, or have thoughts about taking your own life, get help right away. You can go to your nearest emergency department or call:  Your local emergency services (911 in the [place]  A suicide crisis helpline, such as the National Suicide Prevention Lifeline at 1-800-273-8255. This is open 24 hours a day. Summary  Stress is a normal reaction to life events. It can cause problems if it happens too often or for too long.  Practicing  stress management techniques is the best way to treat stress.  Counseling or talk therapy with a mental   health professional may be helpful if you are having trouble managing stress on your own. This information is not intended to replace advice given to you by your health care provider. Make sure you discuss any questions you have with your health care provider. Document Revised: 08/06/2020 Document Reviewed: 08/06/2020 Elsevier Patient Education  2021 [employer].

## 2020-12-23 ENCOUNTER — Other Ambulatory Visit: Payer: Self-pay

## 2020-12-23 DIAGNOSIS — Z862 Personal history of diseases of the blood and blood-forming organs and certain disorders involving the immune mechanism: Secondary | ICD-10-CM

## 2020-12-23 MED ORDER — FERROUS SULFATE 300 (60 FE) MG/5ML PO SYRP: 300.0000 mg | ORAL_SOLUTION | Freq: Every day | ORAL | 3 refills | Status: DC

## 2020-12-23 NOTE — Telephone Encounter (Signed)
Spoke with pt advised to take the medication for muscle relaxer at night due to drowsiness and can also take Tylenol during the day for pain

## 2020-12-29 ENCOUNTER — Telehealth: Payer: Self-pay | Admitting: Family Medicine

## 2020-12-29 NOTE — Telephone Encounter (Signed)
Pt is calling in stating that her back and chest is not feeling any better but she will continue to take the cyclobenzaprine (FLEXERIL) 5 MG use a heating pad and muscle cream b/c she is not able to take the medication at work and that is when she really needs it.  She is aware of what Dr. Salomon Fick put on her AVS if things do not improve.  Pt declined an appointment at this time.   Pt is also concerned about her Rx ferrous sulfate 300 (60 Fe)  MG/5ML syrup when she went to the pharmacy they told her that her Rx would be $100.00 copay and they told her if the doctor would send in a different dosage it would be back at the low copay that she had previously.  Pt would like to see if that can be changed.  Pharm:  [facility] on [address]  Pt would like to have a call back.

## 2020-12-30 NOTE — Telephone Encounter (Signed)
Please advise 

## 2021-01-03 NOTE — Telephone Encounter (Signed)
The rx sent in for the ferrous sulfate 300 mg/5ML syrup was reordered from a previous rx.  Did the pharmacy provide alternatives?  Is pt able to take pill form?

## 2021-01-06 NOTE — Telephone Encounter (Signed)
Left a detailed message with Dr Salomon Fick advise for pt, advised to call her pharmacy and then call the office so Dr Salomon Fick can provide a new Rx

## 2021-01-06 NOTE — Telephone Encounter (Signed)
Spoke with pt state that she still has back pains even with taking the muscle relaxer medication, pt scheduled a f/u visit with Dr Salomon Fick for 01/10/2021 at 3.30pm

## 2021-01-10 ENCOUNTER — Ambulatory Visit (INDEPENDENT_AMBULATORY_CARE_PROVIDER_SITE_OTHER): Payer: Medicaid Other

## 2021-01-10 ENCOUNTER — Encounter: Payer: Self-pay | Admitting: Family Medicine

## 2021-01-10 ENCOUNTER — Ambulatory Visit: Payer: Medicaid Other | Admitting: Family Medicine

## 2021-01-10 ENCOUNTER — Other Ambulatory Visit: Payer: Self-pay

## 2021-01-10 VITALS — BP 116/78 | HR 86 | Temp 97.6°F | Wt 209.0 lb

## 2021-01-10 DIAGNOSIS — F439 Reaction to severe stress, unspecified: Secondary | ICD-10-CM

## 2021-01-10 DIAGNOSIS — R0789 Other chest pain: Secondary | ICD-10-CM

## 2021-01-10 DIAGNOSIS — Z6836 Body mass index (BMI) 36.0-36.9, adult: Secondary | ICD-10-CM

## 2021-01-10 DIAGNOSIS — E6609 Other obesity due to excess calories: Secondary | ICD-10-CM

## 2021-01-10 DIAGNOSIS — D508 Other iron deficiency anemias: Secondary | ICD-10-CM

## 2021-01-10 MED ORDER — FERROUS GLUCONATE 324 (38 FE) MG PO TABS: 324.0000 mg | ORAL_TABLET | Freq: Every day | ORAL | 1 refills | Status: AC

## 2021-01-10 NOTE — Progress Notes (Signed)
Subjective:    Patient ID: Terry Kennedy, female    DOB: [birth year], 30 y.o.   MRN: [id number]  No chief complaint on file. Pt accompanied by her young son.  HPI Patient was seen today for f/u on R chest wall pain.  Pt still having R sided upper chest wall pain that radiates to R mid back.  Flexeril causes drowsiness, but does not get rid of pain.  Pain is a tingling, spasm like sensation, 3/10 right now, but can be 7-8/10 at its worst.  Pt taking tylenol BID.  Pt did not notice the discomfort over the wknd while out of town for her birthday.  Denies injury.  Pt endorses some stress.  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  Pt inquires about weight loss medications. Would like to weigh 175-180 lbs.  Since last OFV started working out twice a wk.  Pt is a vegetarian.  Eating a granola bar for breakfast.  Having lunch and dinner.  Eating dinner around 5:30-6pm and in bed by 8-8:30 pm.  May stay up later if has work to do.  Pt notes she was unable to get the iron supplement from the pharmacy.  In the past pt got liquid iron from the pharmacy without any issues.  Past Medical History:  Diagnosis Date  . Normal pregnancy, first 07/15/2013  . SVD (spontaneous vaginal delivery) 07/16/2013    Allergies  Allergen Reactions  . Mushroom Extract Complex Hives    ROS General: Denies fever, chills, night sweats, changes in weight, changes in appetite  +weight gain, stress HEENT: Denies headaches, ear pain, changes in vision, rhinorrhea, sore throat CV: Denies CP, palpitations, SOB, orthopnea Pulm: Denies SOB, cough, wheezing GI: Denies abdominal pain, nausea, vomiting, diarrhea, constipation GU: Denies dysuria, hematuria, frequency, vaginal discharge Msk: Denies muscle cramps, joint pains  +chest wall pain Neuro: Denies weakness, numbness, tingling Skin: Denies rashes, bruising Psych: Denies depression, anxiety, hallucinations      Objective:     Blood pressure 116/78, pulse 86, temperature 97.6 F (36.4 C), temperature source Oral, weight 209 lb (94.8 kg), SpO2 98 %.  Gen. Pleasant, well-nourished, in no distress, normal affect   HEENT: Progress/AT, face symmetric, conjunctiva clear, no scleral icterus, PERRLA, EOMI, nares patent without drainage Lungs: no accessory muscle use, no wheezes or rales Cardiovascular: RRR, no peripheral edema Musculoskeletal: No deformities, no cyanosis or clubbing, normal tone Neuro:  A&Ox3, CN II-XII intact, normal gait Skin:  Warm, no lesions/ rash   Wt Readings from Last 3 Encounters:  01/10/21 209 lb (94.8 kg)  12/22/20 210 lb 3.2 oz (95.3 kg)  09/20/20 206 lb 3.2 oz (93.5 kg)    Lab Results  Component Value Date   WBC 5.1 12/22/2020   HGB 11.6 (L) 12/22/2020   HCT 34.8 (L) 12/22/2020   PLT 297.0 12/22/2020   GLUCOSE 91 12/22/2020   ALT 9 (L) 10/29/2015   AST 16 10/29/2015   NA 139 12/22/2020   K 4.1 12/22/2020   CL 104 12/22/2020   CREATININE 0.81 12/22/2020   BUN 7 12/22/2020   CO2 30 12/22/2020   TSH 1.39 12/22/2020   HGBA1C 5.7 12/22/2020    Assessment/Plan:  Chest wall pain  -MSK pain, stress/anxiety, intercostal nerve entrapment.  Also consider infection -Will obtain CXR -Continue supportive care -Discussed ways to decrease stress - Plan: DG Chest 2 View  Class 2 obesity due to excess calories without serious comorbidity  with body mass index (BMI) of 36.0 to 36.9 in adult -Discussed the importance of having good, consistent lifestyle modifications in place if interested in starting medication -We will consider starting weight loss medication such as phentermine in the next few weeks  Iron deficiency anemia secondary to inadequate dietary iron intake  -mildly anemic -hgb 11.6 on 12/23/19 - Plan: ferrous gluconate (FERGON) 324 MG tablet  Stress -consider counseling -discussed self care. -consider yoga and mindfulness  F/u prn  Abbe Amsterdam, MD

## 2021-01-10 NOTE — Patient Instructions (Signed)
Textbook of family medicine (9th ed., pp. 1062-1073). [place], [place]: [employer].">  Stress, Adult Stress is a normal reaction to life events. Stress is what you feel when life demands more than you are used to, or more than you think you can handle. Some stress can be useful, such as studying for a test or meeting a deadline at work. Stress that occurs too often or for too long can cause problems. It can affect your emotional health and interfere with relationships and normal daily activities. Too much stress can weaken your body's defense system (immune system) and increase your risk for physical illness. If you already have a medical problem, stress can make it worse. What are the causes? All sorts of life events can cause stress. An event that causes stress for one person may not be stressful for another person. Major life events, whether positive or negative, commonly cause stress. Examples include:  Losing a job or starting a new job.  Losing a loved one.  Moving to a new town or home.  Getting married or divorced.  Having a baby.  Getting injured or sick. Less obvious life events can also cause stress, especially if they occur day after day or in combination with each other. Examples include:  Working long hours.  Driving in traffic.  Caring for children.  Being in debt.  Being in a difficult relationship. What are the signs or symptoms? Stress can cause emotional symptoms, including:  Anxiety. This is feeling worried, afraid, on edge, overwhelmed, or out of control.  Anger, including irritation or impatience.  Depression. This is feeling sad, down, helpless, or guilty.  Trouble focusing, remembering, or making decisions. Stress can cause physical symptoms, including:  Aches and pains. These may affect your head, neck, back, stomach, or other areas of your body.  Tight muscles or a clenched jaw.  Low energy.  Trouble sleeping. Stress can cause unhealthy  behaviors, including:  Eating to feel better (overeating) or skipping meals.  Working too much or putting off tasks.  Smoking, drinking alcohol, or using drugs to feel better. How is this diagnosed? Stress is diagnosed through an assessment by your health care provider. He or she may diagnose this condition based on:  Your symptoms and any stressful life events.  Your medical history.  Tests to rule out other causes of your symptoms. Depending on your condition, your health care provider may refer you to a specialist for further evaluation. How is this treated? Stress management techniques are the recommended treatment for stress. Medicine is not typically recommended for the treatment of stress. Techniques to reduce your reaction to stressful life events include:  Stress identification. Monitor yourself for symptoms of stress and identify what causes stress for you. These skills may help you to avoid or prepare for stressful events.  Time management. Set your priorities, keep a calendar of events, and learn to say no. Taking these actions can help you avoid making too many commitments. Techniques for coping with stress include:  Rethinking the problem. Try to think realistically about stressful events rather than ignoring them or overreacting. Try to find the positives in a stressful situation rather than focusing on the negatives.  Exercise. Physical exercise can release both physical and emotional tension. The key is to find a form of exercise that you enjoy and do it regularly.  Relaxation techniques. These relax the body and mind. The key is to find one or more that you enjoy and use the techniques regularly. Examples include: ?   Meditation, deep breathing, or progressive relaxation techniques. ? Yoga or tai chi. ? Biofeedback, mindfulness techniques, or journaling. ? Listening to music, being out in nature, or participating in other hobbies.  Practicing a healthy lifestyle.  Eat a balanced diet, drink plenty of water, limit or avoid caffeine, and get plenty of sleep.  Having a strong support network. Spend time with family, friends, or other people you enjoy being around. Express your feelings and talk things over with someone you trust. Counseling or talk therapy with a mental health professional may be helpful if you are having trouble managing stress on your own.   Follow these instructions at home: Lifestyle  Avoid drugs.  Do not use any products that contain nicotine or tobacco, such as cigarettes, e-cigarettes, and chewing tobacco. If you need help quitting, ask your health care provider.  Limit alcohol intake to no more than 1 drink a day for nonpregnant women and 2 drinks a day for men. One drink equals 12 oz of beer, 5 oz of wine, or 1 oz of hard liquor  Do not use alcohol or drugs to relax.  Eat a balanced diet that includes fresh fruits and vegetables, whole grains, lean meats, fish, eggs, and beans, and low-fat dairy. Avoid processed foods and foods high in added fat, sugar, and salt.  Exercise at least 30 minutes on 5 or more days each week.  Get 7-8 hours of sleep each night.   General instructions  Practice stress management techniques as discussed with your health care provider.  Drink enough fluid to keep your urine clear or pale yellow.  Take over-the-counter and prescription medicines only as told by your health care provider.  Keep all follow-up visits as told by your health care provider. This is important.   Contact a health care provider if:  Your symptoms get worse.  You have new symptoms.  You feel overwhelmed by your problems and can no longer manage them on your own. Get help right away if:  You have thoughts of hurting yourself or others. If you ever feel like you may hurt yourself or others, or have thoughts about taking your own life, get help right away. You can go to your nearest emergency department or  call:  Your local emergency services (911 in the [place]  A suicide crisis helpline, such as the [facility] at 878-683-5005. This is open 24 hours a day. Summary  Stress is a normal reaction to life events. It can cause problems if it happens too often or for too long.  Practicing stress management techniques is the best way to treat stress.  Counseling or talk therapy with a mental health professional may be helpful if you are having trouble managing stress on your own. This information is not intended to replace advice given to you by your health care provider. Make sure you discuss any questions you have with your health care provider. Document Revised: 08/06/2020 Document Reviewed: 08/06/2020 Elsevier Patient Education  2021 [employer].  Preventing Unhealthy [employer], Adult Staying at a healthy weight is important to your overall health. When fat builds up in your body, you may become overweight or obese. Being overweight or obese increases your risk of developing certain health problems, such as heart disease, diabetes, sleeping problems, joint problems, and some types of cancer. Unhealthy weight gain is often the result of making unhealthy food choices or not getting enough exercise. You can make changes to your lifestyle to prevent obesity and  stay as healthy as possible. What nutrition changes can be made?  Eat only as much as your body needs. To do this: ? Pay attention to signs that you are hungry or full. Stop eating as soon as you feel full. ? If you feel hungry, try drinking water first before eating. Drink enough water so your urine is clear or pale yellow. ? Eat smaller portions. Pay attention to portion sizes when eating out. ? Look at serving sizes on food labels. Most foods contain more than one serving per container. ? Eat the recommended number of calories for your gender and activity level. For most active people, a daily total of 2,000  calories is appropriate. If you are trying to lose weight or are not very active, you may need to eat fewer calories. Talk with your health care provider or a diet and nutrition specialist (dietitian) about how many calories you need each day.  Choose healthy foods, such as: ? Fruits and vegetables. At each meal, try to fill at least half of your plate with fruits and vegetables. ? Whole grains, such as whole-wheat bread, brown rice, and quinoa. ? Lean meats, such as chicken or fish. ? Other healthy proteins, such as beans, eggs, or tofu. ? Healthy fats, such as nuts, seeds, fatty fish, and olive oil. ? Low-fat or fat-free dairy products.  Check food labels, and avoid food and drinks that: ? Are high in calories. ? Have added sugar. ? Are high in sodium. ? Have saturated fats or trans fats.  Cook foods in healthier ways, such as by baking, broiling, or grilling.  Make a meal plan for the week, and shop with a grocery list to help you stay on track with your purchases. Try to avoid going to the grocery store when you are hungry.  When grocery shopping, try to shop around the outside of the store first, where the fresh foods are. Doing this helps you to avoid prepackaged foods, which can be high in sugar, salt (sodium), and fat.   What lifestyle changes can be made?  Exercise for 30 or more minutes on 5 or more days each week. Exercising may include brisk walking, yard work, biking, running, swimming, and team sports like basketball and soccer. Ask your health care provider which exercises are safe for you.  Do muscle-strengthening activities, such as lifting weights or using resistance bands, on 2 or more days a week.  Do not use any products that contain nicotine or tobacco, such as cigarettes and e-cigarettes. If you need help quitting, ask your health care provider.  Limit alcohol intake to no more than 1 drink a day for nonpregnant women and 2 drinks a day for men. One drink equals 12  oz of beer, 5 oz of wine, or 1 oz of hard liquor.  Try to get 7-9 hours of sleep each night.   What other changes can be made?  Keep a food and activity journal to keep track of: ? What you ate and how many calories you had. Remember to count the calories in sauces, dressings, and side dishes. ? Whether you were active, and what exercises you did. ? Your calorie, weight, and activity goals.  Check your weight regularly. Track any changes. If you notice you have gained weight, make changes to your diet or activity routine.  Avoid taking weight-loss medicines or supplements. Talk to your health care provider before starting any new medicine or supplement.  Talk to your health care provider  before trying any new diet or exercise plan. Why are these changes important? Eating healthy, staying active, and having healthy habits can help you to prevent obesity. Those changes also:  Help you manage stress and emotions.  Help you connect with friends and family.  Improve your self-esteem.  Improve your sleep.  Prevent long-term health problems. What can happen if changes are not made? Being obese or overweight can cause you to develop joint or bone problems, which can make it hard for you to stay active or do activities you enjoy. Being obese or overweight also puts stress on your heart and lungs and can lead to health problems like diabetes, heart disease, and some cancers. Where to find more information Talk with your health care provider or a dietitian about healthy eating and healthy lifestyle choices. You may also find information from:  [place] Department of Agriculture, MyPlate: FormerBoss.no  American Heart Association: www.heart.org  Centers for Disease Control and Prevention: http://www.wolf.info/ Summary  Staying at a healthy weight is important to your overall health. It helps you to prevent certain diseases and health problems, such as heart disease, diabetes, joint problems,  sleep disorders, and some types of cancer.  Being obese or overweight can cause you to develop joint or bone problems, which can make it hard for you to stay active or do activities you enjoy.  You can prevent unhealthy weight gain by eating a healthy diet, exercising regularly, not smoking, limiting alcohol, and getting enough sleep.  Talk with your health care provider or a dietitian for guidance about healthy eating and healthy lifestyle choices. This information is not intended to replace advice given to you by your health care provider. Make sure you discuss any questions you have with your health care provider. Document Revised: 03/18/2020 Document Reviewed: 03/18/2020 Elsevier Patient Education  2021 [employer].

## 2021-02-24 ENCOUNTER — Ambulatory Visit: Payer: BC Managed Care – PPO | Admitting: Allergy

## 2021-03-04 ENCOUNTER — Ambulatory Visit: Payer: Medicaid Other | Admitting: Allergy

## 2021-06-01 DIAGNOSIS — N911 Secondary amenorrhea: Secondary | ICD-10-CM | POA: Diagnosis not present

## 2021-06-01 DIAGNOSIS — Z3201 Encounter for pregnancy test, result positive: Secondary | ICD-10-CM | POA: Diagnosis not present

## 2021-06-22 DIAGNOSIS — Z113 Encounter for screening for infections with a predominantly sexual mode of transmission: Secondary | ICD-10-CM | POA: Diagnosis not present

## 2021-06-22 DIAGNOSIS — Z3481 Encounter for supervision of other normal pregnancy, first trimester: Secondary | ICD-10-CM | POA: Diagnosis not present

## 2021-06-22 DIAGNOSIS — Z3A09 9 weeks gestation of pregnancy: Secondary | ICD-10-CM | POA: Diagnosis not present

## 2021-06-22 DIAGNOSIS — O26891 Other specified pregnancy related conditions, first trimester: Secondary | ICD-10-CM | POA: Diagnosis not present

## 2021-06-22 DIAGNOSIS — Z369 Encounter for antenatal screening, unspecified: Secondary | ICD-10-CM | POA: Diagnosis not present

## 2021-06-22 LAB — OB RESULTS CONSOLE ABO/RH: RH Type: POSITIVE

## 2021-06-22 LAB — OB RESULTS CONSOLE HEPATITIS B SURFACE ANTIGEN: Hepatitis B Surface Ag: NEGATIVE

## 2021-06-22 LAB — OB RESULTS CONSOLE RPR: RPR: NONREACTIVE

## 2021-06-22 LAB — HEPATITIS C ANTIBODY: HCV Ab: NEGATIVE

## 2021-06-22 LAB — OB RESULTS CONSOLE GC/CHLAMYDIA
Chlamydia: NEGATIVE
Gonorrhea: NEGATIVE

## 2021-06-22 LAB — OB RESULTS CONSOLE HIV ANTIBODY (ROUTINE TESTING): HIV: NONREACTIVE

## 2021-06-22 LAB — OB RESULTS CONSOLE ANTIBODY SCREEN: Antibody Screen: NEGATIVE

## 2021-06-22 LAB — OB RESULTS CONSOLE RUBELLA ANTIBODY, IGM: Rubella: IMMUNE

## 2021-12-27 LAB — OB RESULTS CONSOLE GBS: GBS: NEGATIVE

## 2022-01-05 DIAGNOSIS — H5213 Myopia, bilateral: Secondary | ICD-10-CM | POA: Diagnosis not present

## 2022-01-18 ENCOUNTER — Encounter (HOSPITAL_COMMUNITY): Payer: Self-pay

## 2022-01-18 ENCOUNTER — Telehealth (HOSPITAL_COMMUNITY): Payer: Self-pay | Admitting: *Deleted

## 2022-01-18 ENCOUNTER — Encounter (HOSPITAL_COMMUNITY): Payer: Self-pay | Admitting: *Deleted

## 2022-01-18 NOTE — Telephone Encounter (Signed)
Preadmission screen  

## 2022-01-23 ENCOUNTER — Other Ambulatory Visit: Payer: Self-pay | Admitting: Obstetrics and Gynecology

## 2022-01-23 LAB — SARS CORONAVIRUS 2 (TAT 6-24 HRS): SARS Coronavirus 2: NEGATIVE

## 2022-01-24 ENCOUNTER — Encounter (HOSPITAL_COMMUNITY): Payer: Self-pay | Admitting: Obstetrics and Gynecology

## 2022-01-24 ENCOUNTER — Other Ambulatory Visit: Payer: Self-pay | Admitting: Obstetrics and Gynecology

## 2022-01-25 ENCOUNTER — Other Ambulatory Visit: Payer: Self-pay

## 2022-01-25 ENCOUNTER — Inpatient Hospital Stay (HOSPITAL_COMMUNITY): Payer: BC Managed Care – PPO

## 2022-01-25 ENCOUNTER — Inpatient Hospital Stay (HOSPITAL_COMMUNITY): Payer: BC Managed Care – PPO | Admitting: Anesthesiology

## 2022-01-25 ENCOUNTER — Inpatient Hospital Stay (HOSPITAL_COMMUNITY)
Admission: AD | Admit: 2022-01-25 | Discharge: 2022-01-29 | DRG: 788 | Disposition: A | Discharge status: Home / Self Care | Payer: BC Managed Care – PPO | Attending: Obstetrics and Gynecology | Admitting: Obstetrics and Gynecology

## 2022-01-25 ENCOUNTER — Encounter (HOSPITAL_COMMUNITY): Payer: Self-pay | Admitting: Obstetrics and Gynecology

## 2022-01-25 DIAGNOSIS — O480 Post-term pregnancy: Secondary | ICD-10-CM | POA: Diagnosis present

## 2022-01-25 DIAGNOSIS — Z3A40 40 weeks gestation of pregnancy: Secondary | ICD-10-CM | POA: Diagnosis not present

## 2022-01-25 DIAGNOSIS — O26893 Other specified pregnancy related conditions, third trimester: Secondary | ICD-10-CM | POA: Diagnosis present

## 2022-01-25 HISTORY — DX: Anemia, unspecified: D64.9

## 2022-01-25 LAB — CBC
HCT: 34.6 % — ABNORMAL LOW (ref 36.0–46.0)
Hemoglobin: 11.7 g/dL — ABNORMAL LOW (ref 12.0–15.0)
MCH: 29.2 pg (ref 26.0–34.0)
MCHC: 33.8 g/dL (ref 30.0–36.0)
MCV: 86.3 fL (ref 80.0–100.0)
Platelets: 213 10*3/uL (ref 150–400)
RBC: 4.01 MIL/uL (ref 3.87–5.11)
RDW: 15.2 % (ref 11.5–15.5)
WBC: 6.7 10*3/uL (ref 4.0–10.5)
nRBC: 0 % (ref 0.0–0.2)

## 2022-01-25 LAB — TYPE AND SCREEN
ABO/RH(D): O POS
Antibody Screen: NEGATIVE

## 2022-01-25 MED ORDER — OXYTOCIN BOLUS FROM INFUSION: 333.0000 mL | Freq: Once | INTRAVENOUS | Status: DC

## 2022-01-25 MED ORDER — LIDOCAINE HCL (PF) 1 % IJ SOLN
30.0000 mL | INTRAMUSCULAR | Status: DC | PRN
Start: 2022-01-25 — End: 2022-01-26

## 2022-01-25 MED ORDER — EPHEDRINE 5 MG/ML INJ: 10.0000 mg | INTRAVENOUS | Status: DC | PRN

## 2022-01-25 MED ORDER — PHENYLEPHRINE 40 MCG/ML (10ML) SYRINGE FOR IV PUSH (FOR BLOOD PRESSURE SUPPORT)
80.0000 ug | PREFILLED_SYRINGE | INTRAVENOUS | Status: DC | PRN
  Administered 2022-01-25: 80 ug via INTRAVENOUS

## 2022-01-25 MED ORDER — OXYCODONE-ACETAMINOPHEN 5-325 MG PO TABS: 1.0000 | ORAL_TABLET | ORAL | Status: DC | PRN

## 2022-01-25 MED ORDER — SOD CITRATE-CITRIC ACID 500-334 MG/5ML PO SOLN
30.0000 mL | ORAL | Status: DC | PRN
Start: 2022-01-25 — End: 2022-01-26
  Administered 2022-01-26: 30 mL via ORAL
  Filled 2022-01-25: qty 30

## 2022-01-25 MED ORDER — DIPHENHYDRAMINE HCL 50 MG/ML IJ SOLN: 12.5000 mg | INTRAMUSCULAR | Status: DC | PRN

## 2022-01-25 MED ORDER — FENTANYL-BUPIVACAINE-NACL 0.5-0.125-0.9 MG/250ML-% EP SOLN
12.0000 mL/h | EPIDURAL | Status: DC | PRN
  Administered 2022-01-25: 12 mL/h via EPIDURAL
  Filled 2022-01-25: qty 250

## 2022-01-25 MED ORDER — TERBUTALINE SULFATE 1 MG/ML IJ SOLN: 0.2500 mg | Freq: Once | INTRAMUSCULAR | Status: DC | PRN

## 2022-01-25 MED ORDER — ACETAMINOPHEN 325 MG PO TABS: 650.0000 mg | ORAL_TABLET | ORAL | Status: DC | PRN

## 2022-01-25 MED ORDER — PHENYLEPHRINE 40 MCG/ML (10ML) SYRINGE FOR IV PUSH (FOR BLOOD PRESSURE SUPPORT)
80.0000 ug | PREFILLED_SYRINGE | INTRAVENOUS | Status: DC | PRN
  Filled 2022-01-25: qty 10

## 2022-01-25 MED ORDER — LACTATED RINGERS IV SOLN
500.0000 mL | Freq: Once | INTRAVENOUS | Status: AC
  Administered 2022-01-25: 500 mL via INTRAVENOUS

## 2022-01-25 MED ORDER — ONDANSETRON HCL 4 MG/2ML IJ SOLN: 4.0000 mg | Freq: Four times a day (QID) | INTRAMUSCULAR | Status: DC | PRN

## 2022-01-25 MED ORDER — BUTORPHANOL TARTRATE 1 MG/ML IJ SOLN
1.0000 mg | INTRAMUSCULAR | Status: DC | PRN
  Administered 2022-01-25: 1 mg via INTRAVENOUS
  Filled 2022-01-25: qty 1

## 2022-01-25 MED ORDER — LIDOCAINE HCL (PF) 1 % IJ SOLN
INTRAMUSCULAR | Status: DC | PRN
  Administered 2022-01-25 (×2): 5 mL via EPIDURAL

## 2022-01-25 MED ORDER — LACTATED RINGERS IV SOLN
500.0000 mL | INTRAVENOUS | Status: DC | PRN
  Administered 2022-01-25 – 2022-01-26 (×3): 500 mL via INTRAVENOUS

## 2022-01-25 MED ORDER — OXYTOCIN-SODIUM CHLORIDE 30-0.9 UT/500ML-% IV SOLN: 2.5000 [IU]/h | INTRAVENOUS | Status: DC

## 2022-01-25 MED ORDER — OXYCODONE-ACETAMINOPHEN 5-325 MG PO TABS: 2.0000 | ORAL_TABLET | ORAL | Status: DC | PRN

## 2022-01-25 MED ORDER — LACTATED RINGERS IV SOLN
INTRAVENOUS | Status: DC
Start: 2022-01-25 — End: 2022-01-26

## 2022-01-25 MED ORDER — OXYTOCIN-SODIUM CHLORIDE 30-0.9 UT/500ML-% IV SOLN
1.0000 m[IU]/min | INTRAVENOUS | Status: DC
  Administered 2022-01-25: 2 m[IU]/min via INTRAVENOUS
  Filled 2022-01-25: qty 500

## 2022-01-25 NOTE — Plan of Care (Signed)

## 2022-01-25 NOTE — Anesthesia Procedure Notes (Signed)
Epidural Patient location during procedure: OB Start time: 01/25/2022 9:37 PM End time: 01/25/2022 9:54 PM  Staffing Anesthesiologist: Duane Boston, MD Performed: anesthesiologist   Preanesthetic Checklist Completed: patient identified, IV checked, site marked, risks and benefits discussed, monitors and equipment checked, pre-op evaluation and timeout performed  Epidural Patient position: sitting Prep: DuraPrep Patient monitoring: heart rate, cardiac monitor, continuous pulse ox and blood pressure Approach: midline Location: L2-L3 Injection technique: LOR saline  Needle:  Needle type: Tuohy  Needle gauge: 17 G Needle length: 9 cm Needle insertion depth: 7 cm Catheter size: 20 Guage Catheter at skin depth: 12 cm Test dose: negative and Other  Assessment Events: blood not aspirated, injection not painful, no injection resistance and negative IV test  Additional Notes Informed consent obtained prior to proceeding including risk of failure, 1% risk of PDPH, risk of minor discomfort and bruising.  Discussed rare but serious complications including epidural abscess, permanent nerve injury, epidural hematoma.  Discussed alternatives to epidural analgesia and patient desires to proceed.  Timeout performed pre-procedure verifying patient name, procedure, and platelet count.  Patient tolerated procedure well.

## 2022-01-25 NOTE — Progress Notes (Signed)
Feeling some ctx Afeb, VSS FHT-130-140, mod variability, + accels, some early and small variable decels, Cat II, ctx q 3-4 min VE-2-3/50/-3, posterior, vtx  Will continue pitocin, monitor, attempt AROM with lower fetal station

## 2022-01-25 NOTE — Progress Notes (Signed)
Comfortable with epidural, had SROM around 2200 Afeb, VSS FHT-130, mod variability, + scalp stim, some variable and short prolonged decels, Cat II, ctx q 2-3 min VE-4/50/-2, vtx  Continue pitocin, monitor progress and FHT, anticipate SVD

## 2022-01-25 NOTE — Anesthesia Preprocedure Evaluation (Signed)
Anesthesia Evaluation  Patient identified by MRN, date of birth, ID band Patient awake    Reviewed: Allergy & Precautions, H&P , Patient's Chart, lab work & pertinent test results  History of Anesthesia Complications Negative for: history of anesthetic complications  Airway Mallampati: II  TM Distance: >3 FB Neck ROM: full    Dental no notable dental hx. (+) Teeth Intact   Pulmonary neg pulmonary ROS,    Pulmonary exam normal breath sounds clear to auscultation       Cardiovascular negative cardio ROS   Rhythm:regular Rate:Normal     Neuro/Psych negative neurological ROS  negative psych ROS   GI/Hepatic negative GI ROS, Neg liver ROS,   Endo/Other  negative endocrine ROS  Renal/GU negative Renal ROS  negative genitourinary   Musculoskeletal   Abdominal Normal abdominal exam  (+)   Peds  Hematology negative hematology ROS (+)   Anesthesia Other Findings   Reproductive/Obstetrics (+) Pregnancy                             Anesthesia Physical  Anesthesia Plan  ASA: 2  Anesthesia Plan: Epidural   Post-op Pain Management:    Induction:   PONV Risk Score and Plan:   Airway Management Planned:   Additional Equipment:   Intra-op Plan:   Post-operative Plan:   Informed Consent: I have reviewed the patients History and Physical, chart, labs and discussed the procedure including the risks, benefits and alternatives for the proposed anesthesia with the patient or authorized representative who has indicated his/her understanding and acceptance.       Plan Discussed with: Anesthesiologist  Anesthesia Plan Comments:         Anesthesia Quick Evaluation

## 2022-01-25 NOTE — H&P (Signed)
Terry Kennedy is a 31 y.o. female, G3 P1011, EGA 40+ weeks with EDC 2-21 presenting for induction.  PNC complicated by anemia treated with iron.  OB History     Gravida  2   Para  1   Term  1   Preterm      AB      Living  1      SAB      IAB      Ectopic      Multiple      Live Births  1          Past Medical History:  Diagnosis Date   Anemia    Normal pregnancy, first 07/15/2013   SVD (spontaneous vaginal delivery) 07/16/2013   Past Surgical History:  Procedure Laterality Date   NO PAST SURGERIES     Family History: family history includes Fibroids in an other family member. Social History:  reports that she has never smoked. She has never used smokeless tobacco. She reports current alcohol use. She reports that she does not use drugs.     Maternal Diabetes: No Genetic Screening: Normal Maternal Ultrasounds/Referrals: Normal Fetal Ultrasounds or other Referrals:  None Maternal Substance Abuse:  No Significant Maternal Medications:  None Significant Maternal Lab Results:  Group B Strep negative Other Comments:  None  Review of Systems  Respiratory: Negative.    Cardiovascular: Negative.   Maternal Medical History:  Contractions: Frequency: irregular.   Perceived severity is mild.   Fetal activity: Perceived fetal activity is normal.   Prenatal complications: no prenatal complications Prenatal Complications - Diabetes: none.  Dilation: 1.5 Effacement (%): 30 Station: Ballotable Exam by:: Sherol Dade RN Blood pressure 117/68, pulse 81, height 5\' 5"  (1.651 m), weight 100.3 kg. Maternal Exam:  Uterine Assessment: Contraction strength is mild.  Contraction frequency is irregular.  Abdomen: Patient reports no abdominal tenderness. Estimated fetal weight is 7.5 lbs.   Fetal presentation: vertex Introitus: Normal vulva. Normal vagina.  Amniotic fluid character: not assessed. Pelvis: adequate for delivery.     Fetal Exam Fetal  Monitor Review: Mode: ultrasound.   Baseline rate: 130.  Variability: moderate (6-25 bpm).   Pattern: accelerations present and no decelerations.   Fetal State Assessment: Category I - tracings are normal.  Physical Exam Vitals reviewed.  Constitutional:      Appearance: Normal appearance.  Cardiovascular:     Rate and Rhythm: Normal rate and regular rhythm.  Pulmonary:     Effort: Pulmonary effort is normal. No respiratory distress.  Abdominal:     Palpations: Abdomen is soft.  Genitourinary:    General: Normal vulva.  Neurological:     Mental Status: She is alert.    Prenatal labs: ABO, Rh: O/Positive/-- (07/20 0000) Antibody: Negative (07/20 0000) Rubella: Immune (07/20 0000) RPR: Nonreactive (07/20 0000)  HBsAg: Negative (07/20 0000)  HIV: Non-reactive (07/20 0000)  GBS: Negative/-- (01/24 0000)   Assessment/Plan: IUP at 40+ weeks for induction, vtx by u/s.  Will start pitocin, monitor progress, anticipate SVD   08-24-1990 Lendon George 01/25/2022, 12:23 PM

## 2022-01-26 ENCOUNTER — Encounter (HOSPITAL_COMMUNITY)
Admission: AD | Disposition: A | Discharge status: Home / Self Care | Payer: Self-pay | Source: Home / Self Care | Attending: Obstetrics and Gynecology

## 2022-01-26 ENCOUNTER — Encounter (HOSPITAL_COMMUNITY): Payer: Self-pay | Admitting: Obstetrics and Gynecology

## 2022-01-26 LAB — RPR: RPR Ser Ql: NONREACTIVE

## 2022-01-26 SURGERY — Surgical Case
Anesthesia: Epidural

## 2022-01-26 MED ORDER — IBUPROFEN 600 MG PO TABS
600.0000 mg | ORAL_TABLET | Freq: Four times a day (QID) | ORAL | Status: DC
  Administered 2022-01-27 – 2022-01-29 (×9): 600 mg via ORAL
  Filled 2022-01-26 (×9): qty 1

## 2022-01-26 MED ORDER — FENTANYL CITRATE (PF) 100 MCG/2ML IJ SOLN
INTRAMUSCULAR | Status: AC
Start: 2022-01-26 — End: ?
  Filled 2022-01-26: qty 2

## 2022-01-26 MED ORDER — ACETAMINOPHEN 10 MG/ML IV SOLN
INTRAVENOUS | Status: AC
  Filled 2022-01-26: qty 100

## 2022-01-26 MED ORDER — ONDANSETRON HCL 4 MG/2ML IJ SOLN
4.0000 mg | Freq: Three times a day (TID) | INTRAMUSCULAR | Status: DC | PRN
  Administered 2022-01-26: 4 mg via INTRAVENOUS
  Filled 2022-01-26: qty 2

## 2022-01-26 MED ORDER — OXYCODONE HCL 5 MG PO TABS
5.0000 mg | ORAL_TABLET | ORAL | Status: DC | PRN
  Administered 2022-01-27: 5 mg via ORAL
  Administered 2022-01-28 (×2): 10 mg via ORAL
  Administered 2022-01-29: 5 mg via ORAL
  Filled 2022-01-26 (×2): qty 1
  Filled 2022-01-26 (×2): qty 2

## 2022-01-26 MED ORDER — SODIUM CHLORIDE 0.9 % IV SOLN
INTRAVENOUS | Status: AC
  Filled 2022-01-26: qty 5

## 2022-01-26 MED ORDER — LACTATED RINGERS IV SOLN: INTRAVENOUS | Status: DC

## 2022-01-26 MED ORDER — OXYTOCIN-SODIUM CHLORIDE 30-0.9 UT/500ML-% IV SOLN: 2.5000 [IU]/h | INTRAVENOUS | Status: AC

## 2022-01-26 MED ORDER — FENTANYL CITRATE (PF) 100 MCG/2ML IJ SOLN
INTRAMUSCULAR | Status: DC | PRN
Start: 2022-01-26 — End: 2022-01-26
  Administered 2022-01-26: 100 ug via EPIDURAL

## 2022-01-26 MED ORDER — LIDOCAINE-EPINEPHRINE (PF) 2 %-1:200000 IJ SOLN
INTRAMUSCULAR | Status: DC | PRN
  Administered 2022-01-26: 10 mL via INTRADERMAL

## 2022-01-26 MED ORDER — OXYTOCIN-SODIUM CHLORIDE 30-0.9 UT/500ML-% IV SOLN
INTRAVENOUS | Status: DC | PRN
  Administered 2022-01-26: 400 mL via INTRAVENOUS

## 2022-01-26 MED ORDER — TETANUS-DIPHTH-ACELL PERTUSSIS 5-2.5-18.5 LF-MCG/0.5 IM SUSY: 0.5000 mL | PREFILLED_SYRINGE | Freq: Once | INTRAMUSCULAR | Status: DC

## 2022-01-26 MED ORDER — SIMETHICONE 80 MG PO CHEW
80.0000 mg | CHEWABLE_TABLET | ORAL | Status: DC | PRN
  Administered 2022-01-28 (×2): 80 mg via ORAL
  Filled 2022-01-26 (×2): qty 1

## 2022-01-26 MED ORDER — ONDANSETRON HCL 4 MG/2ML IJ SOLN
INTRAMUSCULAR | Status: AC
  Filled 2022-01-26: qty 2

## 2022-01-26 MED ORDER — DIPHENHYDRAMINE HCL 50 MG/ML IJ SOLN: 12.5000 mg | INTRAMUSCULAR | Status: DC | PRN

## 2022-01-26 MED ORDER — CEFAZOLIN SODIUM-DEXTROSE 2-3 GM-%(50ML) IV SOLR
INTRAVENOUS | Status: DC | PRN
  Administered 2022-01-26: 2 g via INTRAVENOUS

## 2022-01-26 MED ORDER — PHENYLEPHRINE 40 MCG/ML (10ML) SYRINGE FOR IV PUSH (FOR BLOOD PRESSURE SUPPORT)
PREFILLED_SYRINGE | INTRAVENOUS | Status: AC
Start: 2022-01-26 — End: ?
  Filled 2022-01-26: qty 10

## 2022-01-26 MED ORDER — OXYTOCIN-SODIUM CHLORIDE 30-0.9 UT/500ML-% IV SOLN
INTRAVENOUS | Status: AC
  Filled 2022-01-26: qty 500

## 2022-01-26 MED ORDER — PROMETHAZINE HCL 25 MG/ML IJ SOLN: 6.2500 mg | INTRAMUSCULAR | Status: DC | PRN

## 2022-01-26 MED ORDER — SENNOSIDES-DOCUSATE SODIUM 8.6-50 MG PO TABS
2.0000 | ORAL_TABLET | ORAL | Status: DC
  Administered 2022-01-27 – 2022-01-29 (×3): 2 via ORAL
  Filled 2022-01-26 (×3): qty 2

## 2022-01-26 MED ORDER — ACETAMINOPHEN 500 MG PO TABS
1000.0000 mg | ORAL_TABLET | Freq: Four times a day (QID) | ORAL | Status: DC
  Administered 2022-01-26 – 2022-01-29 (×12): 1000 mg via ORAL
  Filled 2022-01-26 (×12): qty 2

## 2022-01-26 MED ORDER — MEASLES, MUMPS & RUBELLA VAC IJ SOLR: 0.5000 mL | Freq: Once | INTRAMUSCULAR | Status: DC

## 2022-01-26 MED ORDER — DEXAMETHASONE SODIUM PHOSPHATE 4 MG/ML IJ SOLN
INTRAMUSCULAR | Status: AC
  Filled 2022-01-26: qty 1

## 2022-01-26 MED ORDER — PRENATAL MULTIVITAMIN CH
1.0000 | ORAL_TABLET | Freq: Every day | ORAL | Status: DC
  Administered 2022-01-27 – 2022-01-28 (×2): 1 via ORAL
  Filled 2022-01-26 (×2): qty 1

## 2022-01-26 MED ORDER — MAGNESIUM HYDROXIDE 400 MG/5ML PO SUSP: 30.0000 mL | ORAL | Status: DC | PRN

## 2022-01-26 MED ORDER — SODIUM CHLORIDE 0.9% FLUSH: 3.0000 mL | INTRAVENOUS | Status: DC | PRN

## 2022-01-26 MED ORDER — TERBUTALINE SULFATE 1 MG/ML IJ SOLN: 0.2500 mg | Freq: Once | INTRAMUSCULAR | Status: DC | PRN

## 2022-01-26 MED ORDER — DEXAMETHASONE SODIUM PHOSPHATE 4 MG/ML IJ SOLN
INTRAMUSCULAR | Status: DC | PRN
  Administered 2022-01-26: 4 mg via INTRAVENOUS

## 2022-01-26 MED ORDER — ZOLPIDEM TARTRATE 5 MG PO TABS: 5.0000 mg | ORAL_TABLET | Freq: Every evening | ORAL | Status: DC | PRN

## 2022-01-26 MED ORDER — DIPHENHYDRAMINE HCL 25 MG PO CAPS: 25.0000 mg | ORAL_CAPSULE | ORAL | Status: DC | PRN

## 2022-01-26 MED ORDER — ONDANSETRON HCL 4 MG/2ML IJ SOLN
INTRAMUSCULAR | Status: DC | PRN
  Administered 2022-01-26: 4 mg via INTRAVENOUS

## 2022-01-26 MED ORDER — KETOROLAC TROMETHAMINE 30 MG/ML IJ SOLN
30.0000 mg | Freq: Four times a day (QID) | INTRAMUSCULAR | Status: AC
  Administered 2022-01-26 – 2022-01-27 (×3): 30 mg via INTRAVENOUS
  Filled 2022-01-26 (×3): qty 1

## 2022-01-26 MED ORDER — SCOPOLAMINE 1 MG/3DAYS TD PT72
1.0000 | MEDICATED_PATCH | Freq: Once | TRANSDERMAL | Status: AC
  Administered 2022-01-26: 1.5 mg via TRANSDERMAL
  Filled 2022-01-26: qty 1

## 2022-01-26 MED ORDER — SODIUM CHLORIDE 0.9 % IR SOLN
Status: DC | PRN
  Administered 2022-01-26: 1

## 2022-01-26 MED ORDER — LACTATED RINGERS IV SOLN: INTRAVENOUS | Status: DC | PRN

## 2022-01-26 MED ORDER — NALOXONE HCL 0.4 MG/ML IJ SOLN: 0.4000 mg | INTRAMUSCULAR | Status: DC | PRN

## 2022-01-26 MED ORDER — SODIUM CHLORIDE 0.9 % IV SOLN: INTRAVENOUS | Status: DC | PRN

## 2022-01-26 MED ORDER — MENTHOL 3 MG MT LOZG: 1.0000 | LOZENGE | OROMUCOSAL | Status: DC | PRN

## 2022-01-26 MED ORDER — CEFAZOLIN SODIUM-DEXTROSE 2-4 GM/100ML-% IV SOLN
INTRAVENOUS | Status: AC
  Filled 2022-01-26: qty 100

## 2022-01-26 MED ORDER — WITCH HAZEL-GLYCERIN EX PADS: 1.0000 "application " | MEDICATED_PAD | CUTANEOUS | Status: DC | PRN

## 2022-01-26 MED ORDER — METHYLERGONOVINE MALEATE 0.2 MG PO TABS: 0.2000 mg | ORAL_TABLET | ORAL | Status: DC | PRN

## 2022-01-26 MED ORDER — DIPHENHYDRAMINE HCL 25 MG PO CAPS: 25.0000 mg | ORAL_CAPSULE | Freq: Four times a day (QID) | ORAL | Status: DC | PRN

## 2022-01-26 MED ORDER — NALOXONE HCL 4 MG/10ML IJ SOLN
1.0000 ug/kg/h | INTRAVENOUS | Status: DC | PRN
  Filled 2022-01-26: qty 5

## 2022-01-26 MED ORDER — MORPHINE SULFATE (PF) 0.5 MG/ML IJ SOLN
INTRAMUSCULAR | Status: AC
  Filled 2022-01-26: qty 10

## 2022-01-26 MED ORDER — PHENYLEPHRINE HCL (PRESSORS) 10 MG/ML IV SOLN
INTRAVENOUS | Status: DC | PRN
  Administered 2022-01-26: 80 ug via INTRAVENOUS
  Administered 2022-01-26: 200 ug via INTRAVENOUS
  Administered 2022-01-26: 40 ug via INTRAVENOUS
  Administered 2022-01-26: 200 ug via INTRAVENOUS
  Administered 2022-01-26: 120 ug via INTRAVENOUS
  Administered 2022-01-26 (×2): 80 ug via INTRAVENOUS

## 2022-01-26 MED ORDER — FENTANYL CITRATE (PF) 100 MCG/2ML IJ SOLN: 25.0000 ug | INTRAMUSCULAR | Status: DC | PRN

## 2022-01-26 MED ORDER — OXYTOCIN-SODIUM CHLORIDE 30-0.9 UT/500ML-% IV SOLN: 1.0000 m[IU]/min | INTRAVENOUS | Status: DC

## 2022-01-26 MED ORDER — MORPHINE SULFATE (PF) 0.5 MG/ML IJ SOLN
INTRAMUSCULAR | Status: DC | PRN
Start: 2022-01-26 — End: 2022-01-26
  Administered 2022-01-26: 3 mg via EPIDURAL

## 2022-01-26 MED ORDER — DIBUCAINE (PERIANAL) 1 % EX OINT
1.0000 | TOPICAL_OINTMENT | CUTANEOUS | Status: DC | PRN
Start: 2022-01-26 — End: 2022-01-29

## 2022-01-26 MED ORDER — COCONUT OIL OIL: 1.0000 "application " | TOPICAL_OIL | Status: DC | PRN

## 2022-01-26 MED ORDER — METHYLERGONOVINE MALEATE 0.2 MG/ML IJ SOLN: 0.2000 mg | INTRAMUSCULAR | Status: DC | PRN

## 2022-01-26 MED ORDER — AZITHROMYCIN 500 MG IV SOLR
INTRAVENOUS | Status: DC | PRN
Start: 2022-01-26 — End: 2022-01-26
  Administered 2022-01-26: 500 mg via INTRAVENOUS

## 2022-01-26 MED ORDER — MEPERIDINE HCL 25 MG/ML IJ SOLN: 6.2500 mg | INTRAMUSCULAR | Status: DC | PRN

## 2022-01-26 MED ORDER — AMISULPRIDE (ANTIEMETIC) 5 MG/2ML IV SOLN: 10.0000 mg | Freq: Once | INTRAVENOUS | Status: DC | PRN

## 2022-01-26 SURGICAL SUPPLY — 33 items
APL SKNCLS STERI-STRIP NONHPOA (GAUZE/BANDAGES/DRESSINGS) ×1
BENZOIN TINCTURE PRP APPL 2/3 (GAUZE/BANDAGES/DRESSINGS) ×1 IMPLANT
CHLORAPREP W/TINT 26ML (MISCELLANEOUS) ×2 IMPLANT
CLAMP CORD UMBIL (MISCELLANEOUS) IMPLANT
CLOTH BEACON ORANGE TIMEOUT ST (SAFETY) ×2 IMPLANT
DRSG OPSITE POSTOP 4X10 (GAUZE/BANDAGES/DRESSINGS) ×2 IMPLANT
ELECT REM PT RETURN 9FT ADLT (ELECTROSURGICAL) ×2
ELECTRODE REM PT RTRN 9FT ADLT (ELECTROSURGICAL) ×1 IMPLANT
EXTRACTOR VACUUM KIWI (MISCELLANEOUS) IMPLANT
EXTRACTOR VACUUM M CUP 4 TUBE (SUCTIONS) IMPLANT
GLOVE BIOGEL PI IND STRL 7.0 (GLOVE) ×1 IMPLANT
GLOVE BIOGEL PI INDICATOR 7.0 (GLOVE) ×1
GLOVE ORTHO TXT STRL SZ7.5 (GLOVE) ×2 IMPLANT
GOWN STRL REUS W/TWL LRG LVL3 (GOWN DISPOSABLE) ×4 IMPLANT
KIT ABG SYR 3ML LUER SLIP (SYRINGE) IMPLANT
NDL HYPO 25X5/8 SAFETYGLIDE (NEEDLE) ×1 IMPLANT
NEEDLE HYPO 25X5/8 SAFETYGLIDE (NEEDLE) ×2 IMPLANT
NS IRRIG 1000ML POUR BTL (IV SOLUTION) ×2 IMPLANT
PACK C SECTION WH (CUSTOM PROCEDURE TRAY) ×2 IMPLANT
PAD OB MATERNITY 4.3X12.25 (PERSONAL CARE ITEMS) ×2 IMPLANT
PENCIL SMOKE EVAC W/HOLSTER (ELECTROSURGICAL) ×2 IMPLANT
RTRCTR C-SECT PINK 25CM LRG (MISCELLANEOUS) ×3 IMPLANT
STRIP CLOSURE SKIN 1/2X4 (GAUZE/BANDAGES/DRESSINGS) ×1 IMPLANT
SUT CHROMIC 1 CTX 36 (SUTURE) ×4 IMPLANT
SUT PLAIN 0 NONE (SUTURE) IMPLANT
SUT PLAIN 2 0 XLH (SUTURE) ×1 IMPLANT
SUT VIC AB 0 CT1 27 (SUTURE) ×4
SUT VIC AB 0 CT1 27XBRD ANBCTR (SUTURE) ×2 IMPLANT
SUT VIC AB 2-0 CT1 (SUTURE) ×2 IMPLANT
SUT VIC AB 4-0 KS 27 (SUTURE) IMPLANT
TOWEL OR 17X24 6PK STRL BLUE (TOWEL DISPOSABLE) ×2 IMPLANT
TRAY FOLEY W/BAG SLVR 14FR LF (SET/KITS/TRAYS/PACK) ×2 IMPLANT
WATER STERILE IRR 1000ML POUR (IV SOLUTION) ×2 IMPLANT

## 2022-01-26 NOTE — Addendum Note (Signed)
Addendum  created 01/26/22 1159 by Heather Roberts, MD   Attestation recorded in [facility], Intraprocedure Attestations filed

## 2022-01-26 NOTE — Op Note (Signed)
Preoperative diagnosis: Intrauterine pregnancy at 40 weeks, fetal intolerance of labor Postoperative diagnosis: Same Procedure: Primary low transverse cesarean section without extensions Surgeon: Lavina Hamman M.D. Anesthesia: Epidural Findings: Patient had normal gravid anatomy and delivered a viable female infant with Apgars of 8 and 9 weight pending Estimated blood loss: 250 cc Specimens: Placenta sent to labor and delivery Complications: None  Procedure in detail: The patient was taken to the operating room and placed in the  dorsosupine position with left tilt. Her previously placed epidural was dosed appropriately.  Abdomen was then prepped and draped in the usual sterile fashion, a foley catheter had previously been inserted. The level of her anesthesia was found to be adequate. Abdomen was entered via a standard Pfannenstiel incision. Once the peritoneal cavity was entered the Alexis disposable self-retaining retractor was placed and good visualization was achieved. A 4 cm transverse incision was then made in the lower uterine segment pushing the bladder inferior. Once the uterine cavity was entered the incision was extended digitally, clear amniotic fluid. The fetal vertex was grasped and delivered through the incision atraumatically. Mouth and nares were suctioned. The remainder of the infant then delivered atraumatically. Cord was doubly clamped and cut after one minute and the infant handed to the awaiting pediatric team. Cord blood was obtained. The placenta delivered spontaneously. Uterus was wiped dry with clean lap pad and all clots and debris were removed. Uterine incision was inspected and found to be free of extensions. Uterine incision was closed in 1 layer with running locking #1 Chromic. Tubes and ovaries were inspected and found to be normal. Uterine incision was inspected and found to be hemostatic. Bleeding from serosal edges was controlled with electrocautery. The Alexis  retractor was removed. Subfascial space was irrigated and made hemostatic with electrocautery. Fascia was closed in running fashion starting at both ends and meeting in the middle with 0 Vicryl. Subcutaneous tissue was then irrigated and made hemostatic with electrocautery, then closed with running 2-0 plain gut. Skin was closed with running 4-0 Vicryl subcuticular suture followed by steri-strips and a sterile dressing. Patient tolerated the procedure well and was taken to the recovery in stable condition. Counts were correct x2, she received Ancef 2 g IV and Zithromax 500 mg IV at the beginning of the procedure and she had PAS hose on throughout the procedure.

## 2022-01-26 NOTE — Transfer of Care (Signed)
Immediate Anesthesia Transfer of Care Note  Patient: Terry Kennedy  Procedure(s) Performed: CESAREAN SECTION  Patient Location: PACU  Anesthesia Type:Epidural  Level of Consciousness: awake, alert  and oriented  Airway & Oxygen Therapy: Patient Spontanous Breathing  Post-op Assessment: Report given to RN and Post -op Vital signs reviewed and stable  Post vital signs: Reviewed and stable BP 93/53  Last Vitals:  Vitals Value Taken Time  BP    Temp    Pulse 80 01/26/22 0657  Resp 13 01/26/22 0657  SpO2 99 % 01/26/22 0657  Vitals shown include unvalidated device data.  Last Pain:  Vitals:   01/26/22 0431  TempSrc:   PainSc: Asleep         Complications: No notable events documented.

## 2022-01-26 NOTE — Progress Notes (Signed)
Comfortable with epidural Afeb, VSS FHT-has had several episodes of prolonged decels requiring pitocin to be turned off, ctx space out to q 5-6 min off pitocin VE-8/90/-1, vtx, FSE placed with last prolonged decel  Discussed situation, recommended c-section for fetal intolerance of labor.  Procedure and risks discussed, questions answered, OR notified, will proceed urgently while FHT is stable

## 2022-01-26 NOTE — Anesthesia Postprocedure Evaluation (Signed)
Anesthesia Post Note  Patient: [name]  Procedure(s) Performed: CESAREAN SECTION     Patient location during evaluation: PACU Anesthesia Type: Epidural Level of consciousness: awake and alert and oriented Pain management: pain level controlled Vital Signs Assessment: post-procedure vital signs reviewed and stable Respiratory status: spontaneous breathing, nonlabored ventilation and respiratory function stable Cardiovascular status: blood pressure returned to baseline and stable Postop Assessment: no headache, no backache, epidural receding and no apparent nausea or vomiting Anesthetic complications: no   No notable events documented.  Last Vitals:  Vitals:   01/26/22 0715 01/26/22 0730  BP: (!) 97/55 (!) 94/53  Pulse: 78 73  Resp: 14 15  Temp: (!) 35.7 C (!) 35.7 C  SpO2: 100%     Last Pain:  Vitals:   01/26/22 0730  TempSrc: Tympanic  PainSc: 0-No pain   Pain Goal:    LLE Motor Response: Purposeful movement (01/26/22 0730)   RLE Motor Response: Purposeful movement (01/26/22 0730)       Epidural/Spinal Function Cutaneous sensation: Able to Discern Pressure (01/26/22 0730), Patient able to flex knees: No (01/26/22 0730), Patient able to lift hips off bed: No (01/26/22 0730), Back pain beyond tenderness at insertion site: No (01/26/22 0730), Progressively worsening motor and/or sensory loss: No (01/26/22 0730), Bowel and/or bladder incontinence post epidural: No (01/26/22 0730)  Lannie Fields

## 2022-01-26 NOTE — Lactation Note (Signed)
This note was copied from a baby's chart. Lactation Consultation Note  Patient Name: Terry Kennedy Date: 01/26/2022 Reason for consult: Initial assessment;Mother's request;Term;Breastfeeding assistance Age:32 hours Mom feeding plan to EBF. Infant latched with signs of milk transfer. Mom denied any pain with the latch.   Plan 1. To feed based on cues 8-112x 24hr period. Mom to offer breasts and look for signs of milk transfer.  2. If unable to latch, Mom to offer from hand expression EBM via spoon 5-7 ml per feeding.  3. I and O sheet reviewed.  All questions answered at the end of the visit.   Maternal Data Has patient been taught Hand Expression?: Yes Does the patient have breastfeeding experience prior to this delivery?: Yes How long did the patient breastfeed?: 4  1/2 years 8 years ago  Feeding Mother's Current Feeding Choice: Breast Milk  LATCH Score Latch: Repeated attempts needed to sustain latch, nipple held in mouth throughout feeding, stimulation needed to elicit sucking reflex.  Audible Swallowing: Spontaneous and intermittent  Type of Nipple: Everted at rest and after stimulation  Comfort (Breast/Nipple): Soft / non-tender  Hold (Positioning): Assistance needed to correctly position infant at breast and maintain latch.  LATCH Score: 8   Lactation Tools Discussed/Used    Interventions Interventions: Breast feeding basics reviewed;Assisted with latch;Skin to skin;Breast massage;Hand express;Breast compression;Adjust position;Support pillows;Position options;Expressed milk;DEBP;Education;Scientist, research (physical sciences)  Discharge Pump: Personal;Manual WIC Program: Yes  Consult Status Consult Status: Follow-up Date: 01/27/22 Follow-up type: In-patient    Terry Kennedy  Terry Kennedy 01/26/2022, 12:47 PM

## 2022-01-27 ENCOUNTER — Encounter (HOSPITAL_COMMUNITY): Payer: Self-pay | Admitting: Obstetrics and Gynecology

## 2022-01-27 LAB — CBC
HCT: 31 % — ABNORMAL LOW (ref 36.0–46.0)
Hemoglobin: 10.2 g/dL — ABNORMAL LOW (ref 12.0–15.0)
MCH: 29.1 pg (ref 26.0–34.0)
MCHC: 32.9 g/dL (ref 30.0–36.0)
MCV: 88.3 fL (ref 80.0–100.0)
Platelets: 183 10*3/uL (ref 150–400)
RBC: 3.51 MIL/uL — ABNORMAL LOW (ref 3.87–5.11)
RDW: 15.2 % (ref 11.5–15.5)
WBC: 9 10*3/uL (ref 4.0–10.5)
nRBC: 0 % (ref 0.0–0.2)

## 2022-01-27 NOTE — Progress Notes (Signed)
°  Patient is eating, ambulating, voiding.  Pain control is good.  Vitals:   01/26/22 1123 01/26/22 1541 01/26/22 1945 01/27/22 0553  BP: (!) 112/56 104/61 (!) 106/51 (!) 99/59  Pulse: 68 67 72 73  Resp: 16 16 18 18   Temp: 97.7 F (36.5 C) 97.8 F (36.6 C) 98.2 F (36.8 C) 97.8 F (36.6 C)  TempSrc: Axillary Axillary Oral Oral  SpO2: 99% 99% 100% 100%  Weight:      Height:        lungs:   clear to auscultation cor:    RRR Abdomen:  soft, appropriate tenderness, incisions intact and without erythema or exudate ex:    no cords   Lab Results  Component Value Date   WBC 9.0 01/27/2022   HGB 10.2 (L) 01/27/2022   HCT 31.0 (L) 01/27/2022   MCV 88.3 01/27/2022   PLT 183 01/27/2022    --/--/O POS (02/22 1155)/RI  A/P    Post operative day 1.  Routine post op and postpartum care.  Expect d/c tomorrow.  Oxycodone for pain control.

## 2022-01-27 NOTE — Lactation Note (Addendum)
This note was copied from a baby's chart. Lactation Consultation Note  Patient Name: Terry Kennedy ESLPN'P Date: 01/27/2022 Reason for consult: Follow-up assessment;Mother's request;Difficult latch;Term;Breastfeeding assistance Age:32 hours  Mom states infant latching for short periods of time, very sleepy at the breast. On arrival, Mom had infant in a outfit and swaddled holding her in cradle hold. Infant sleeping. We tried hand expression to offer some colostrum and then placed her on the pump.   Mom feeding plan to EBF. Mom to offer more volume with pumping and giving back her EBM.   Infant does have a anterior lingual attachment, mother states with NS infant able to sustain latch longer. Mom to discuss lingual attachment with Pediatrician.   RN, Wallie Renshaw aware of findings above.  Mom had beads of colostrum with pumping and beads in shield after latching for 8 min.   Mother states if needed supplement her choice would be DBM. LC alerted RN mom feeding choice for supplementation.   Plan 1. To feed based on cues 8-12x 24hr period. Mom to latch infant STS and look for signs of milk transfer.  2. Mom to supplement with EBM 7-12 ml per feeding.  3. DEBP q 3hrs for 15 min  All questions answered at the end of the visit.   Mom aware DBM is available to supplement if needed.   Maternal Data Has patient been taught Hand Expression?: Yes  Feeding Mother's Current Feeding Choice: Breast Milk  LATCH Score                    Lactation Tools Discussed/Used Tools: Pump;Flanges Flange Size: 24;27 Breast pump type: Double-Electric Breast Pump Pump Education: Setup, frequency, and cleaning;Milk Storage Reason for Pumping: increase stimulation Pumping frequency: every 3 hrs for 15 min  Interventions Interventions: Breast feeding basics reviewed;Hand express;Breast compression;Expressed milk;DEBP;Education;Infant Driven Feeding Algorithm education;Pace  feeding  Discharge    Consult Status Consult Status: Follow-up Date: 01/28/22 Follow-up type: In-patient    Tomiko Schoon  [name] 01/27/2022, 5:09 PM

## 2022-01-28 ENCOUNTER — Encounter (HOSPITAL_COMMUNITY): Payer: Self-pay | Admitting: Obstetrics and Gynecology

## 2022-01-28 NOTE — Progress Notes (Signed)
ABD binder given with instructions per order

## 2022-01-28 NOTE — Progress Notes (Signed)
Subjective: Postpartum Day 2: Cesarean Delivery [name] is doing okay this morning. Reports incisional pain, worse with ambulating. The pain is reduced with pain meds, but she is not comfortable going home with this level of pain. She is interested in abdominal binder. She is ambulating, voiding, tolerating PO. Passing flatus. Frustrated with lack of milk supply, working with LC on pumping.  Minimal lochia.   Objective: Patient Vitals for the past 24 hrs:  BP Temp Temp src Pulse Resp SpO2  01/28/22 0521 117/67 97.7 F (36.5 C) Oral 75 18 100 %  01/27/22 2127 108/63 98.2 F (36.8 C) -- 66 19 100 %  01/27/22 1455 105/66 98 F (36.7 C) Oral 86 18 98 %   Physical Exam:  General: alert, cooperative, and no distress Lochia: appropriate Uterine Fundus: firm Incision: healing well, no significant drainage, no dehiscence, no significant erythema DVT Evaluation: No evidence of DVT seen on physical exam.  Recent Labs    01/25/22 1157 01/27/22 0503  HGB 11.7* 10.2*  HCT 34.6* 31.0*    Assessment/Plan:  [name] [id number] POD#2 sp cesarean at [redacted]w[redacted]d for fetal intolerance to labor 1. PPC: routine PP care, ordered abdominal binder, continue to work on pain control. Continue to work with LC 2. Rh pos, rubella immune 3. Dispo: discharge later today vs. Tomorrow depending on pain control  [name] 01/28/2022, 11:01 AM

## 2022-01-28 NOTE — Lactation Note (Signed)
This note was copied from a baby's chart. Lactation Consultation Note Mom sitting in recliner holding baby stating the baby hadn't been long finished BF. Mom denies painful latches. Mom is hoping that she will only have to BF when she goes home and her milk comes in that it will be enough. Mom is worried that the colostrum wouldn't be enough for now that is why she is supplementing. Discussed I&O and breast massage. Encouraged to call for questions or assistance.  Patient Name: Terry Kennedy ZOXWR'[id number] Date: 01/28/2022 Reason for consult: Follow-up assessment;Term Age:54 hours  Maternal Data    Feeding Mother's Current Feeding Choice: Breast Milk and Donor Milk  LATCH Score                    Lactation Tools Discussed/Used    Interventions    Discharge    Consult Status Consult Status: Follow-up Date: 01/29/22 Follow-up type: In-patient    Charyl Dancer 01/28/2022, 8:49 PM

## 2022-01-29 MED ORDER — IBUPROFEN 600 MG PO TABS: 600.0000 mg | ORAL_TABLET | Freq: Four times a day (QID) | ORAL | 1 refills | Status: AC | PRN

## 2022-01-29 MED ORDER — OXYCODONE HCL 5 MG PO TABS: 5.0000 mg | ORAL_TABLET | ORAL | 0 refills | Status: DC | PRN

## 2022-01-29 NOTE — Discharge Summary (Signed)
Postpartum Discharge Summary  Date of Service updated 01/29/22      Patient Name: Terry Kennedy DOB: 09-29-1991 MRN: [id number]  Date of admission: 01/25/2022 Delivery date:01/26/2022  Delivering provider: Cheri Fowler  Date of discharge: 01/29/2022  Admitting diagnosis: Indication for care in labor or delivery [O75.9] Intrauterine pregnancy: [redacted]w[redacted]d    Secondary diagnosis:  Principal Problem:   Indication for care in labor or delivery  Additional problems: none    Discharge diagnosis: Term Pregnancy Delivered                                              Post partum procedures: none Augmentation: Pitocin Complications: None  Hospital course: Induction of Labor With Cesarean Section   31y.o. yo G2P2002 at 460w2das admitted to the hospital 01/25/2022 for induction of labor. P The patient went for cesarean section due to Non-Reassuring FHR. Delivery details are as follows: Membrane Rupture Time/Date: 9:03 PM ,01/25/2022   Delivery Method:C-Section, Low Transverse  Details of operation can be found in separate operative Note.  Patient had an uncomplicated postpartum course. She is ambulating, tolerating a regular diet, passing flatus, and urinating well.  Patient is discharged home in stable condition on 01/29/22.      Newborn Data: Birth date:01/26/2022  Birth time:5:57 AM  Gender:Female  Living status:Living  Apgars:7 ,9  Weight:3510 g                                Magnesium Sulfate received: No BMZ received: No Rhophylac:N/A MMR:N/A T-DaP:Given prenatally Flu: No Transfusion:No  Physical exam  Vitals:   01/28/22 1534 01/28/22 2125 01/29/22 0628 01/29/22 0723  BP: 117/66 111/71 (!) 159/141 113/67  Pulse: 85 75 (!) 120 (!) 105  Resp: '18 18 18   ' Temp: 97.7 F (36.5 C) 98.4 F (36.9 C) 98.2 F (36.8 C)   TempSrc:  Oral Oral   SpO2: 100% 100% 100% 100%  Weight:      Height:       General: alert, cooperative, and no distress Lochia:  appropriate Uterine Fundus: firm Incision: Healing well with no significant drainage, No significant erythema, Dressing is clean, dry, and intact DVT Evaluation: No evidence of DVT seen on physical exam. Labs: Lab Results  Component Value Date   WBC 9.0 01/27/2022   HGB 10.2 (L) 01/27/2022   HCT 31.0 (L) 01/27/2022   MCV 88.3 01/27/2022   PLT 183 01/27/2022   CMP Latest Ref Rng & Units 12/22/2020  Glucose 70 - 99 mg/dL 91  BUN 6 - 23 mg/dL 7  Creatinine 0.40 - 1.20 mg/dL 0.81  Sodium 135 - 145 mEq/L 139  Potassium 3.5 - 5.1 mEq/L 4.1  Chloride 96 - 112 mEq/L 104  CO2 19 - 32 mEq/L 30  Calcium 8.4 - 10.5 mg/dL 9.8  Total Protein 6.5 - 8.1 g/dL -  Total Bilirubin 0.3 - 1.2 mg/dL -  Alkaline Phos 38 - 126 U/L -  AST 15 - 41 U/L -  ALT 14 - 54 U/L -   Edinburgh Score: Edinburgh Postnatal Depression Scale Screening Tool 01/27/2022  I have been able to laugh and see the funny side of things. 0  I have looked forward with enjoyment to things. 0  I have blamed myself unnecessarily when things  went wrong. 2  I have been anxious or worried for no good reason. 2  I have felt scared or panicky for no good reason. 2  Things have been getting on top of me. 1  I have been so unhappy that I have had difficulty sleeping. 0  I have felt sad or miserable. 0  I have been so unhappy that I have been crying. 0  The thought of harming myself has occurred to me. 0  Edinburgh Postnatal Depression Scale Total 7      After visit meds:  Allergies as of 01/29/2022       Reactions   Mushroom Extract Complex Hives        Medication List     STOP taking these medications    cyclobenzaprine 5 MG tablet Commonly known as: FLEXERIL       TAKE these medications    acetaminophen 500 MG tablet Commonly known as: TYLENOL Take 500 mg by mouth every 6 (six) hours as needed (for pain.).   Auvi-Q 0.3 mg/0.3 mL Soaj injection Generic drug: EPINEPHrine Inject 0.3 mg into the muscle once as  needed for anaphylaxis. As directed for life-threatening allergic reactions   Azelastine-Fluticasone 137-50 MCG/ACT Susp Commonly known as: Dymista Place 2 sprays into both nostrils 1 day or 1 dose.   ferrous gluconate 324 MG tablet Commonly known as: FERGON Take 1 tablet (324 mg total) by mouth daily with breakfast.   ibuprofen 600 MG tablet Commonly known as: ADVIL Take 1 tablet (600 mg total) by mouth every 6 (six) hours as needed.   oxyCODONE 5 MG immediate release tablet Commonly known as: Oxy IR/ROXICODONE Take 1 tablet (5 mg total) by mouth every 4 (four) hours as needed for severe pain.         Discharge home in stable condition Infant Feeding: Breast Infant Disposition:home with mother Discharge instruction: per After Visit Summary and Postpartum booklet. Activity: Advance as tolerated. Pelvic rest for 6 weeks.  Diet: routine diet Anticipated Birth Control: Unsure Postpartum Appointment:6 weeks Additional Postpartum F/U: Incision check 2 weeks Future Appointments:No future appointments. Follow up Visit:  Follow-up Information     Meisinger, Todd, MD. Schedule an appointment as soon as possible for a visit in 2 week(s).   Specialty: Obstetrics and Gynecology Why: Incision check Contact information: [address] [place] [address] [phone]                     01/29/2022 Rowland Lathe, MD

## 2022-01-29 NOTE — Progress Notes (Signed)
Subjective: Postpartum Day 3: Cesarean Delivery Terry Kennedy is feeling much better today and ready to go home. Her pain is much better controlled. She has worked with [facility] and feels more comfortable with feeding. She is ambulating, voiding, tolerating PO. Minimal lochia.   Objective: Patient Vitals for the past 24 hrs:  BP Temp Temp src Pulse Resp SpO2  01/29/22 0723 113/67 -- -- (!) 105 -- 100 %  01/29/22 0628 (!) 159/141 98.2 F (36.8 C) Oral (!) 120 18 100 %  01/28/22 2125 111/71 98.4 F (36.9 C) Oral 75 18 100 %  01/28/22 1534 117/66 97.7 F (36.5 C) -- 85 18 100 %    Physical Exam:  General: alert, cooperative, and no distress Lochia: appropriate Uterine Fundus: firm Incision: healing well, no significant drainage, no dehiscence, no significant erythema DVT Evaluation: No evidence of DVT seen on physical exam.  Recent Labs    01/27/22 0503  HGB 10.2*  HCT 31.0*    Assessment/Plan:  Terry Kennedy [id number] POD#3 sp cesarean at [redacted]w[redacted]d for fetal intolerance to labor 1. PPC: continue routine PP care 2. Rh pos, rubella immune 3. Dispo: discharge home. Discharge instructions reviewed  Terry Kennedy 01/29/2022, 9:47 AM

## 2022-01-29 NOTE — Lactation Note (Signed)
This note was copied from a baby's chart. Lactation Consultation Note  Patient Name: Terry Kennedy JOINO'M Date: 01/29/2022 Reason for consult: Follow-up assessment Age:32 hours  Baby cueing after recent DM feeding.   Noted short lingual frenulum.  Suggest discussing with Ped MD.  Provided family with resource sheet. Assisted with latching in football hold.  Baby eager. Discussed post pumping. Reviewed engorgement care and monitoring voids/stools.  Feeding Mother's Current Feeding Choice: Breast Milk and Donor Milk  LATCH Score Latch: Grasps breast easily, tongue down, lips flanged, rhythmical sucking.  Audible Swallowing: A few with stimulation  Type of Nipple: Everted at rest and after stimulation  Comfort (Breast/Nipple): Soft / non-tender  Hold (Positioning): Assistance needed to correctly position infant at breast and maintain latch.  LATCH Score: 8   Lactation Tools Discussed/Used Tools: Pump Breast pump type: Double-Electric Breast Pump Pumping frequency:  (Recommend q 3 hours)  Interventions Interventions: Breast feeding basics reviewed;Assisted with latch;Adjust position;Support pillows;Position options;DEBP;Education  Discharge Discharge Education: Engorgement and breast care;Warning signs for feeding baby;Outpatient recommendation Pump: Personal;DEBP (Medela Pump n Style)  Consult Status Consult Status: Complete Date: 01/29/22    Dahlia Byes [facility] 01/29/2022, 9:01 AM

## 2022-01-30 ENCOUNTER — Telehealth: Payer: Self-pay

## 2022-01-30 NOTE — Telephone Encounter (Signed)
Transition Care Management Unsuccessful Follow-up Telephone Call  Date of discharge and from where:  01/29/2022-[facility]  Attempts:  1st Attempt  Reason for unsuccessful TCM follow-up call:  Left voice message

## 2022-01-31 NOTE — Telephone Encounter (Signed)
Transition Care Management Follow-up Telephone Call Date of discharge and from where: 01/29/2022-[facility]  How have you been since you were released from the hospital? Pt stated she is feeling better now that she has her medication.  Any questions or concerns? No  Items Reviewed: Did the pt receive and understand the discharge instructions provided? Yes  Medications obtained and verified? Yes  Other? No  Any new allergies since your discharge? No  Dietary orders reviewed? No Do you have support at home? Yes   Home Care and Equipment/Supplies: Were home health services ordered? not applicable If so, what is the name of the agency? N/A  Has the agency set up a time to come to the patient's home? not applicable Were any new equipment or medical supplies ordered?  No What is the name of the medical supply agency? N/A Were you able to get the supplies/equipment? not applicable Do you have any questions related to the use of the equipment or supplies? No  Functional Questionnaire: (I = Independent and D = Dependent) ADLs: I  Bathing/Dressing- I  Meal Prep- I  Eating- I  Maintaining continence- I  Transferring/Ambulation- I  Managing Meds- I  Follow up appointments reviewed:  PCP Hospital f/u appt confirmed? No   Specialist Hospital f/u appt confirmed? No   Are transportation arrangements needed? No  If their condition worsens, is the pt aware to call PCP or go to the Emergency Dept.? Yes Was the patient provided with contact information for the PCP's office or ED? Yes Was to pt encouraged to call back with questions or concerns? Yes

## 2022-02-07 ENCOUNTER — Telehealth (HOSPITAL_COMMUNITY): Payer: Self-pay

## 2022-02-07 NOTE — Telephone Encounter (Signed)
No answer. Left message to return nurse call. ? Heron Nay ?02/07/2022,1508 ?

## 2022-03-24 ENCOUNTER — Ambulatory Visit (INDEPENDENT_AMBULATORY_CARE_PROVIDER_SITE_OTHER): Payer: BC Managed Care – PPO | Admitting: Family Medicine

## 2022-03-24 ENCOUNTER — Encounter: Payer: Self-pay | Admitting: Family Medicine

## 2022-03-24 VITALS — BP 111/78 | HR 98 | Temp 98.4°F | Wt 201.0 lb

## 2022-03-24 DIAGNOSIS — M654 Radial styloid tenosynovitis [de Quervain]: Secondary | ICD-10-CM

## 2022-03-24 DIAGNOSIS — M25532 Pain in left wrist: Secondary | ICD-10-CM

## 2022-03-24 DIAGNOSIS — M546 Pain in thoracic spine: Secondary | ICD-10-CM

## 2022-03-24 NOTE — Progress Notes (Signed)
eSubjective:  ? ? Patient ID: Terry Kennedy, female    DOB: [birth year], 31 y.o.   MRN: [id number] ? ?Chief Complaint  ?Patient presents with  ? Wrist Pain  ?  Left wrist and thumb pain, also sharp pain in back that goes around body,causing nausea.pain in thumb and wrist for 2.5 weeks, was told by OB/GYN it was PP carpal tunnel, has not raken anything for either pain, was told to reach out to PCP  ? ? ?HPI ?Patient is a 31 yo female G60P2 with pmh sig for c-section, who was seen today for ongoing concern.  Pt notes L lateral wrist and thumb pain x 2-4 wks.  Notes stiffness in am, pain with lifting objects and her newborn.  Pt at times feels like she may drop objects lifted with L hand.  Edema in lateral L wrist.  Denies numbness, tingling.  Pt also notes intermittent sharp pain that starts in mid R back and goes around to front of body.  Pain so intense that causes nausea.  Pain does not last long.   ? ?Pt accompanied by her 2 mo daughter Terry Kennedy. ? ?Past Medical History:  ?Diagnosis Date  ? Anemia   ? Normal pregnancy, first 07/15/2013  ? SVD (spontaneous vaginal delivery) 07/16/2013  ? ? ?Allergies  ?Allergen Reactions  ? Mushroom Extract Complex Hives  ? ? ?ROS ?General: Denies fever, chills, night sweats, changes in weight, changes in appetite ?HEENT: Denies headaches, ear pain, changes in vision, rhinorrhea, sore throat ?CV: Denies CP, palpitations, SOB, orthopnea ?Pulm: Denies SOB, cough, wheezing ?GI: Denies abdominal pain, nausea, vomiting, diarrhea, constipation ?GU: Denies dysuria, hematuria, frequency, vaginal discharge ?Msk: Denies muscle cramps, joint pains +L wrist pain ?Neuro: Denies weakness, numbness, tingling ?Skin: Denies rashes, bruising ?Psych: Denies depression, anxiety, hallucinations ? ? ?   ?Objective:  ?  ?Blood pressure 111/78, pulse 98, temperature 98.4 ?F (36.9 ?C), temperature source Oral, weight 201 lb (91.2 kg), SpO2 96 %, currently breastfeeding. ? ?Gen. Pleasant, well-nourished, in  no distress, normal affect   ?HEENT: Sumpter/AT, face symmetric, conjunctiva clear, no scleral icterus, PERRLA, EOMI, nares patent without drainage ?Lungs: no accessory muscle use ?Cardiovascular: RRR, no peripheral edema ?Musculoskeletal: Mild edema of left lateral wrist with TTP proximal to anatomic snuffbox.decreased grip strength 2/2 discomfort.  Discomfort with movement of left thumb No TTP of cervical, thoracic, or lumbar spine.  No deformities, no cyanosis or clubbing, normal tone ?Neuro:  A&Ox3, CN II-XII intact, normal gait ?Skin:  Warm, no lesions/ rash ? ? ?Wt Readings from Last 3 Encounters:  ?03/24/22 201 lb (91.2 kg)  ?01/25/22 221 lb 3.2 oz (100.3 kg)  ?01/10/21 209 lb (94.8 kg)  ? ? ?Lab Results  ?Component Value Date  ? WBC 9.0 01/27/2022  ? HGB 10.2 (L) 01/27/2022  ? HCT 31.0 (L) 01/27/2022  ? PLT 183 01/27/2022  ? GLUCOSE 91 12/22/2020  ? ALT 9 (L) 10/29/2015  ? AST 16 10/29/2015  ? NA 139 12/22/2020  ? K 4.1 12/22/2020  ? CL 104 12/22/2020  ? CREATININE 0.81 12/22/2020  ? BUN 7 12/22/2020  ? CO2 30 12/22/2020  ? TSH 1.39 12/22/2020  ? HGBA1C 5.7 12/22/2020  ? ? ?Assessment/Plan: ? ?De Quervain's tenosynovitis, left ?-Discussed treatment options including OTC Tylenol, wrist splint, rest.  Also discussed steroid injection. ?-Placed referral to hand surgery, Ortho, sports medicine for steroid injection ?- Plan: Ambulatory referral to Hand Surgery ? ?Left wrist pain ?-TTP and edema over left wrist proximal to  anatomic snuffbox ?-Discussed likely causes including de Quervain's.  Less likely carpal tunnel. ? ?Breast feeding status of mother ? ?Acute right-sided thoracic back pain ?-Discussed possible causes of pain including bone spurs herniates this causing radicular pain.  Less likely renal calculi given location of pain.  Advised symptoms less likely related to epidural given location. ?-Discussed expectant management of symptoms including Tylenol, massage, and heat ?-Reviewed ergonomics and proper body  mechanics when lifting ?-Follow-up for continued symptoms ? ?F/u as needed ? ?Grier Mitts, MD ?

## 2022-03-27 ENCOUNTER — Ambulatory Visit: Payer: BC Managed Care – PPO | Admitting: Family Medicine

## 2022-04-12 ENCOUNTER — Telehealth: Payer: Self-pay | Admitting: Family Medicine

## 2022-04-12 NOTE — Telephone Encounter (Signed)
Pt requesting to speak with provider regarding an extension of her maternity leave, discussed at previous visit. Pls call patient at [phone] ?

## 2022-04-12 NOTE — Telephone Encounter (Signed)
Spoke with pt, informed her that PCP is not abe to extend maternity leave. That would have to be done by OB/GYN, she then stated she was needing an extended medical leave. Advised her that her employer would have the paperwork, or information she needs. ?

## 2022-04-17 ENCOUNTER — Telehealth: Payer: Self-pay | Admitting: Family Medicine

## 2022-04-17 NOTE — Telephone Encounter (Signed)
FMLA form to be filled out--placed in dr's folder.  Patient needs this filled out and returned by Wednesday. ?

## 2022-04-18 NOTE — Telephone Encounter (Signed)
Pt calling to check on progress. Needs paperwork by wednesday ?

## 2022-04-19 NOTE — Telephone Encounter (Signed)
Received FMLA forms to extend maternity leave however unable to complete as patient would need to have OB/GYN complete.   ?If forms are regarding wrist discomfort 2/2 de Quervain's tenosynovitis, pt was sent to Ortho for steroid injections.  If still having discomfort status post injections would have Ortho completed forms. ?

## 2022-04-20 NOTE — Telephone Encounter (Signed)
Spoke with pt, FMLA is for her wrist, informed her that ortho would be better to complete them. Advised the forms will be at front desk if she would like to pick them up and take to ortho.

## 2022-07-04 ENCOUNTER — Ambulatory Visit
Admission: EM | Admit: 2022-07-04 | Discharge: 2022-07-04 | Disposition: A | Discharge status: Home / Self Care | Payer: BC Managed Care – PPO | Attending: Physician Assistant | Admitting: Physician Assistant

## 2022-07-04 DIAGNOSIS — J0100 Acute maxillary sinusitis, unspecified: Secondary | ICD-10-CM | POA: Diagnosis not present

## 2022-07-04 MED ORDER — AMOXICILLIN-POT CLAVULANATE 875-125 MG PO TABS: 1.0000 | ORAL_TABLET | Freq: Two times a day (BID) | ORAL | 0 refills | Status: DC

## 2022-07-04 NOTE — ED Triage Notes (Signed)
Pt presents with sinus congestion, pressure, and right ear pain since waking up this morning.

## 2022-07-04 NOTE — ED Provider Notes (Signed)
[facility]    CSN: [id number] Arrival date & time: 07/04/22  1520      History   Chief Complaint Chief Complaint  Patient presents with   Sinutis    HPI Terry Kennedy is a 31 y.o. female.   Patient here today for evaluation of sinus congestion, pressure and right ear pain that started this morning.  She has had a low-grade fever and reports that sinus pressure is also only to the right maxillary area.  She denies any cough.  She has not had any other symptoms.  She has tried over-the-counter medication without significant relief.  The history is provided by the patient.    Past Medical History:  Diagnosis Date   Anemia    Normal pregnancy, first 07/15/2013   SVD (spontaneous vaginal delivery) 07/16/2013    Patient Active Problem List   Diagnosis Date Noted   Indication for care in labor or delivery 01/25/2022   History of COVID-19 01/20/2020   Normal pregnancy, first 07/15/2013   Supervision of low-risk pregnancy 01/02/2013    Past Surgical History:  Procedure Laterality Date   CESAREAN SECTION N/A 01/26/2022   Procedure: CESAREAN SECTION;  Surgeon: Lavina Hamman, MD;  Location: [facility];  Service: Obstetrics;  Laterality: N/A;   NO PAST SURGERIES      OB History     Gravida  2   Para  2   Term  2   Preterm      AB      Living  2      SAB      IAB      Ectopic      Multiple  0   Live Births  2            Home Medications    Prior to Admission medications   Medication Sig Start Date End Date Taking? Authorizing Provider  amoxicillin-clavulanate (AUGMENTIN) 875-125 MG tablet Take 1 tablet by mouth every 12 (twelve) hours. 07/04/22  Yes Tomi Bamberger, PA-C  acetaminophen (TYLENOL) 500 MG tablet Take 500 mg by mouth every 6 (six) hours as needed (for pain.).    [provider]  AUVI-Q 0.3 MG/0.3ML SOAJ injection Inject 0.3 mg into the muscle once as needed for anaphylaxis. As directed for  life-threatening allergic reactions 09/27/20   Marcelyn Bruins, MD  Azelastine-Fluticasone [facility]) 137-50 MCG/ACT SUSP Place 2 sprays into both nostrils 1 day or 1 dose. 09/21/20   Marcelyn Bruins, MD  ferrous gluconate (FERGON) 324 MG tablet Take 1 tablet (324 mg total) by mouth daily with breakfast. 01/10/21   Deeann Saint, MD  ibuprofen (ADVIL) 600 MG tablet Take 1 tablet (600 mg total) by mouth every 6 (six) hours as needed. 01/29/22   [name], MD  oxyCODONE (OXY IR/ROXICODONE) 5 MG immediate release tablet Take 1 tablet (5 mg total) by mouth every 4 (four) hours as needed for severe pain. Patient not taking: Reported on 03/24/2022 01/29/22   [name], MD    Family History Family History  Problem Relation Age of Onset   Fibroids Other     Social History Social History   Tobacco Use   Smoking status: Never   Smokeless tobacco: Never  Vaping Use   Vaping Use: Never used  Substance Use Topics   Alcohol use: Yes    Comment: socially   Drug use: No     Allergies   Mushroom extract complex  Review of Systems Review of Systems  Constitutional:  Negative for activity change, chills and fever.  HENT:  Positive for congestion, ear pain and sinus pressure.   Eyes:  Negative for discharge and redness.  Respiratory:  Negative for cough and shortness of breath.   Gastrointestinal:  Negative for abdominal pain, nausea and vomiting.     Physical Exam Triage Vital Signs ED Triage Vitals  Enc Vitals Group     BP 07/04/22 1538 104/60     Pulse Rate 07/04/22 1538 (!) 107     Resp 07/04/22 1538 18     Temp 07/04/22 1538 99.1 F (37.3 C)     Temp Source 07/04/22 1538 Oral     SpO2 07/04/22 1538 97 %     Weight --      Height --      Head Circumference --      Peak Flow --      Pain Score 07/04/22 1536 5     Pain Loc --      Pain Edu? --      Excl. in GC? --    No data found.  Updated Vital Signs BP 104/60 (BP Location:  Left Arm)   Pulse (!) 107   Temp 99.1 F (37.3 C) (Oral)   Resp 18   SpO2 97%   Breastfeeding Yes      Physical Exam Vitals and nursing note reviewed.  Constitutional:      General: She is not in acute distress.    Appearance: Normal appearance. She is not ill-appearing.  HENT:     Head: Normocephalic and atraumatic.     Left Ear: Tympanic membrane normal.     Ears:     Comments: Right TM dull and mildly erythematous    Nose: Congestion present.     Mouth/Throat:     Mouth: Mucous membranes are moist.     Pharynx: Oropharynx is clear.  Eyes:     Conjunctiva/sclera: Conjunctivae normal.  Cardiovascular:     Rate and Rhythm: Normal rate.  Pulmonary:     Effort: Pulmonary effort is normal.  Neurological:     Mental Status: She is alert.  Psychiatric:        Mood and Affect: Mood normal.        Behavior: Behavior normal.        Thought Content: Thought content normal.      UC Treatments / Results  Labs (all labs ordered are listed, but only abnormal results are displayed) Labs Reviewed - No data to display  EKG   Radiology No results found.  Procedures Procedures (including critical care time)  Medications Ordered in UC Medications - No data to display  Initial Impression / Assessment and Plan / UC Course  I have reviewed the triage vital signs and the nursing notes.  Pertinent labs & imaging results that were available during my care of the patient were reviewed by me and considered in my medical decision making (see chart for details).    We will treat to cover sinusitis and possible pending otitis media with Augmentin.  Encouraged follow-up if symptoms fail to improve or worsen.  Final Clinical Impressions(s) / UC Diagnoses   Final diagnoses:  Acute non-recurrent maxillary sinusitis   Discharge Instructions   None    ED Prescriptions     Medication Sig Dispense Auth. Provider   amoxicillin-clavulanate (AUGMENTIN) 875-125 MG tablet Take 1  tablet by mouth every 12 (twelve) hours. 14 tablet Izola Price,  Sallee Lange, PA-C      PDMP not reviewed this encounter.   Francene Finders, PA-C 07/04/22 1719

## 2022-07-05 ENCOUNTER — Telehealth: Payer: BC Managed Care – PPO | Admitting: Family Medicine

## 2022-08-09 NOTE — Progress Notes (Signed)
Erroneous encounter

## 2022-11-22 IMAGING — DX DG CHEST 2V
2 series · 2 of 2 positions shown · non-contrast
Comparison: PA and lateral chest 10/28/2017.

CLINICAL DATA: Intermittent right chest and back pain for 6 months.

EXAM:
CHEST - 2 VIEW

[chest pa]
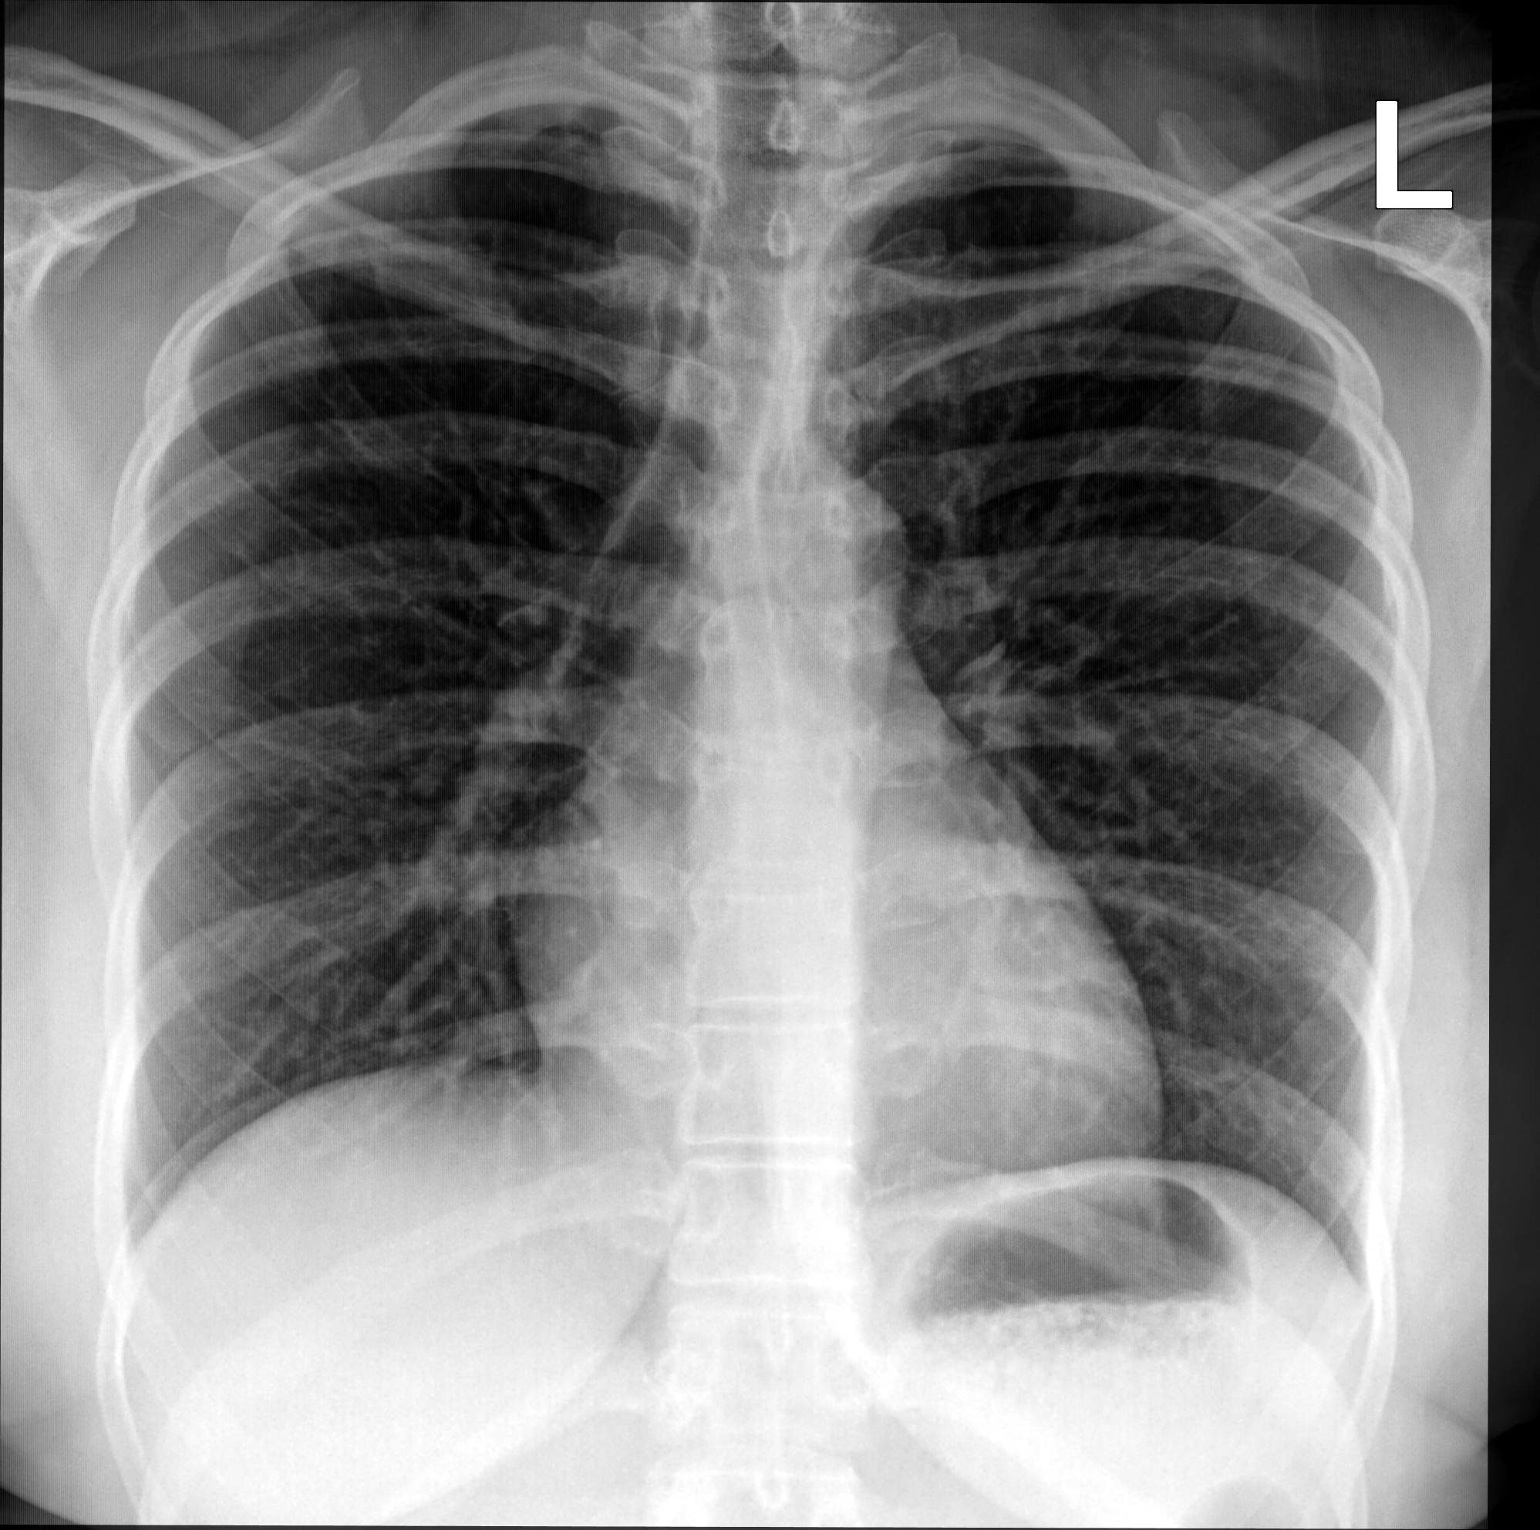

[chest lat]
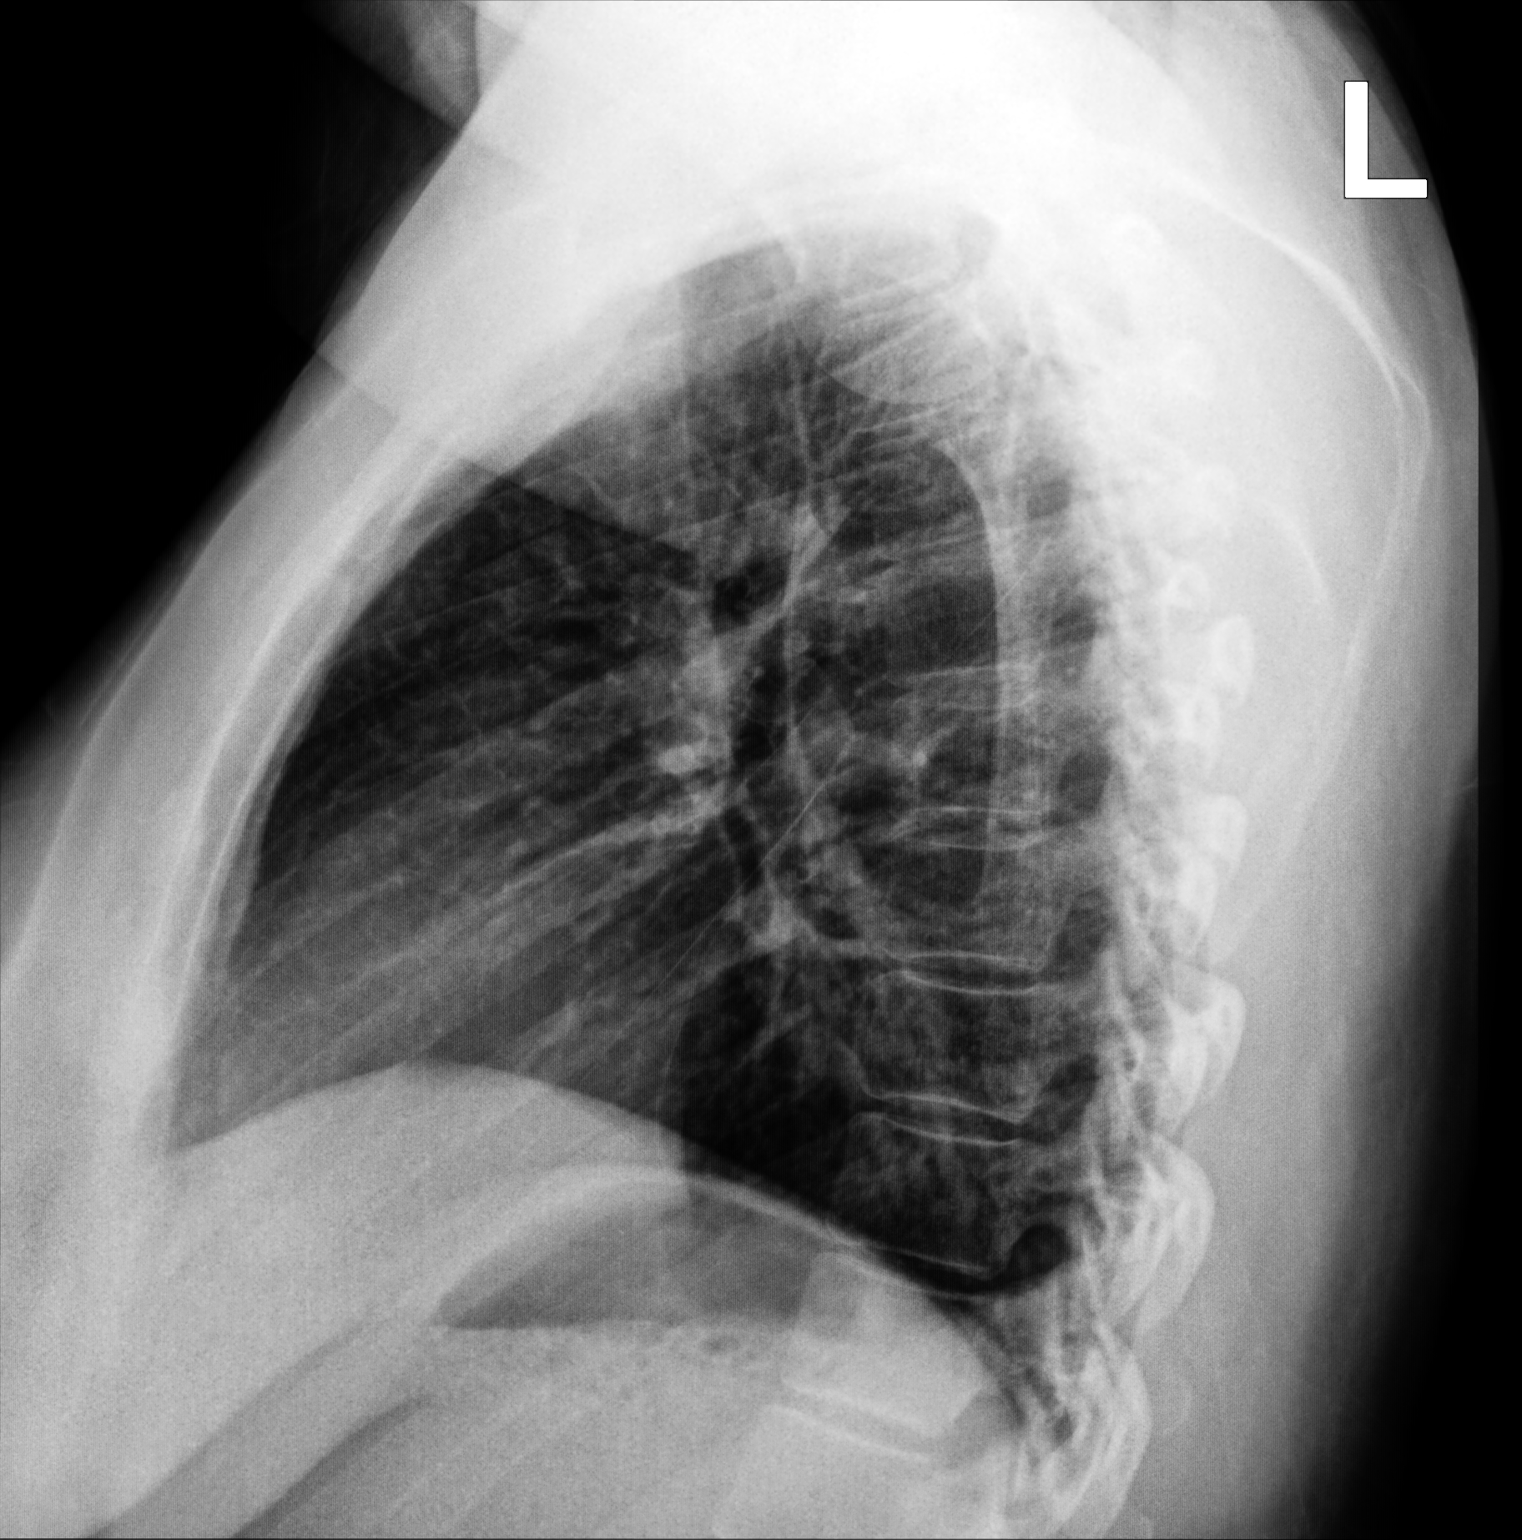

[2 of 2 positions shown; findings below may reference images not displayed]

FINDINGS: The lungs are clear. Heart size is normal. No pneumothorax or
pleural fluid. No acute or focal bony abnormality.
IMPRESSION: Negative chest.

## 2023-07-11 ENCOUNTER — Ambulatory Visit (INDEPENDENT_AMBULATORY_CARE_PROVIDER_SITE_OTHER): Payer: BC Managed Care – PPO | Admitting: Family Medicine

## 2023-07-11 VITALS — BP 100/68 | HR 91 | Temp 99.4°F | Ht 64.5 in | Wt 221.8 lb

## 2023-07-11 DIAGNOSIS — Z113 Encounter for screening for infections with a predominantly sexual mode of transmission: Secondary | ICD-10-CM

## 2023-07-11 DIAGNOSIS — Z0000 Encounter for general adult medical examination without abnormal findings: Secondary | ICD-10-CM | POA: Diagnosis not present

## 2023-07-11 DIAGNOSIS — E669 Obesity, unspecified: Secondary | ICD-10-CM | POA: Diagnosis not present

## 2023-07-11 DIAGNOSIS — E049 Nontoxic goiter, unspecified: Secondary | ICD-10-CM | POA: Diagnosis not present

## 2023-07-11 DIAGNOSIS — Z6837 Body mass index (BMI) 37.0-37.9, adult: Secondary | ICD-10-CM

## 2023-07-11 DIAGNOSIS — F439 Reaction to severe stress, unspecified: Secondary | ICD-10-CM | POA: Diagnosis not present

## 2023-07-11 NOTE — Progress Notes (Unsigned)
Established Patient Office Visit   Subjective  Patient ID: Terry Kennedy, female    DOB: [birth year]  Age: 32 y.o. MRN: [id number]  Chief Complaint  Patient presents with  . Annual Exam    Pt is a 32 yo female seen for    Patient Active Problem List   Diagnosis Date Noted  . Indication for care in labor or delivery 01/25/2022  . History of COVID-19 01/20/2020  . Normal pregnancy, first 07/15/2013  . Supervision of low-risk pregnancy 01/02/2013   Past Surgical History:  Procedure Laterality Date  . CESAREAN SECTION N/A 01/26/2022   Procedure: CESAREAN SECTION;  Surgeon: Lavina Hamman, MD;  Location: [facility];  Service: Obstetrics;  Laterality: N/A;  . NO PAST SURGERIES     Social History   Tobacco Use  . Smoking status: Never  . Smokeless tobacco: Never  Vaping Use  . Vaping status: Never Used  Substance Use Topics  . Alcohol use: Yes    Comment: socially  . Drug use: No   Family Status  Relation Name Status  . Other  (Not Specified)  No partnership data on file   Family History  Problem Relation Age of Onset  . Fibroids Other    Allergies  Allergen Reactions  . Mushroom Extract Complex Hives      ROS Negative unless stated above    Objective:     BP 100/68 (BP Location: Right Arm, Patient Position: Sitting, Cuff Size: Large)   Pulse 91   Temp 99.4 F (37.4 C) (Oral)   Ht 5' 4.5" (1.638 m)   Wt 221 lb 12.8 oz (100.6 kg)   LMP 06/30/2023   SpO2 98%   BMI 37.48 kg/m  {Vitals History (Optional):23777}  Physical Exam Constitutional:      Appearance: Normal appearance.  HENT:     Head: Normocephalic and atraumatic.     Right Ear: Tympanic membrane, ear canal and external ear normal.     Left Ear: Tympanic membrane, ear canal and external ear normal.     Nose: Nose normal.     Mouth/Throat:     Mouth: Mucous membranes are moist.     Pharynx: No oropharyngeal exudate or posterior oropharyngeal erythema.  Eyes:     General: No  scleral icterus.    Extraocular Movements: Extraocular movements intact.     Conjunctiva/sclera: Conjunctivae normal.     Pupils: Pupils are equal, round, and reactive to light.  Neck:     Thyroid: No thyromegaly.     Comments: Goiter R>L Cardiovascular:     Rate and Rhythm: Normal rate and regular rhythm.     Pulses: Normal pulses.     Heart sounds: Normal heart sounds. No murmur heard.    No friction rub.  Pulmonary:     Effort: Pulmonary effort is normal.     Breath sounds: Normal breath sounds. No wheezing, rhonchi or rales.  Abdominal:     General: Bowel sounds are normal.     Palpations: Abdomen is soft.     Tenderness: There is no abdominal tenderness.  Musculoskeletal:        General: No deformity. Normal range of motion.  Lymphadenopathy:     Cervical: No cervical adenopathy.  Skin:    General: Skin is warm and dry.     Findings: No lesion.  Neurological:     General: No focal deficit present.     Mental Status: She is alert and oriented to person, place, and  time.  Psychiatric:        Mood and Affect: Mood normal.        Thought Content: Thought content normal.     No results found for any visits on 07/11/23.    Assessment & Plan:  Well adult exam -     Hemoglobin A1c -     Lipid panel -     Comprehensive metabolic panel  Routine screening for STI (sexually transmitted infection) -     RPR -     C. trachomatis/N. gonorrhoeae RNA -     HIV Antibody (routine testing w rflx)  Stress -     CBC with Differential/Platelet -     TSH -     T4, free  Goiter -     TSH -     T4, free  Class 2 obesity without serious comorbidity with body mass index (BMI) of 37.0 to 37.9 in adult, unspecified obesity type -     TSH -     T4, free -     Hemoglobin A1c -     VITAMIN D 25 Hydroxy (Vit-D Deficiency, Fractures)    Return in about 3 months (around 10/11/2023), or if symptoms worsen or fail to improve.   Deeann Saint, MD

## 2023-07-15 ENCOUNTER — Encounter: Payer: Self-pay | Admitting: Family Medicine

## 2023-07-15 ENCOUNTER — Other Ambulatory Visit: Payer: Self-pay | Admitting: Family Medicine

## 2023-07-15 DIAGNOSIS — E559 Vitamin D deficiency, unspecified: Secondary | ICD-10-CM

## 2023-07-15 MED ORDER — VITAMIN D (ERGOCALCIFEROL) 1.25 MG (50000 UNIT) PO CAPS: 50000.0000 [IU] | ORAL_CAPSULE | ORAL | 0 refills | Status: DC

## 2023-09-24 ENCOUNTER — Ambulatory Visit: Payer: BC Managed Care – PPO | Admitting: Podiatry

## 2023-10-10 ENCOUNTER — Other Ambulatory Visit: Payer: Self-pay | Admitting: Family Medicine

## 2023-10-10 DIAGNOSIS — E559 Vitamin D deficiency, unspecified: Secondary | ICD-10-CM

## 2023-10-11 ENCOUNTER — Encounter: Payer: Self-pay | Admitting: Family Medicine

## 2023-10-11 ENCOUNTER — Ambulatory Visit: Payer: BC Managed Care – PPO | Admitting: Family Medicine

## 2023-10-11 VITALS — BP 98/68 | HR 75 | Temp 99.0°F | Ht 64.5 in | Wt 220.8 lb

## 2023-10-11 DIAGNOSIS — E559 Vitamin D deficiency, unspecified: Secondary | ICD-10-CM | POA: Diagnosis not present

## 2023-10-11 DIAGNOSIS — E66812 Obesity, class 2: Secondary | ICD-10-CM

## 2023-10-11 DIAGNOSIS — R635 Abnormal weight gain: Secondary | ICD-10-CM

## 2023-10-11 DIAGNOSIS — Z6837 Body mass index (BMI) 37.0-37.9, adult: Secondary | ICD-10-CM

## 2023-10-11 NOTE — Progress Notes (Signed)
Established Patient Office Visit   Subjective  Patient ID: Terry Kennedy, female    DOB: 01-Aug-1991  Age: 32 y.o. MRN: [id number]  Chief Complaint  Patient presents with   Medical Management of Chronic Issues    Vit D,     Patient is a 32 year old female seen for follow-up.  Patient completed 12-week course of ergocalciferol.  Noticed increase in energy pursing in the morning which seemed to wane by lunch.  Patient ordered Corrie [employer]andy [employer] vitamins to start.  Patient states she is continuing to exercise and eat healthy but has not noticed any weight loss.  Following meal plans on line/with trainer.  Inquires about other things she can do to help with weight.    Patient Active Problem List   Diagnosis Date Noted   Indication for care in labor or delivery 01/25/2022   History of COVID-19 01/20/2020   Normal pregnancy, first 07/15/2013   Supervision of low-risk pregnancy 01/02/2013   Past Medical History:  Diagnosis Date   Anemia    Normal pregnancy, first 07/15/2013   SVD (spontaneous vaginal delivery) 07/16/2013   Past Surgical History:  Procedure Laterality Date   CESAREAN SECTION N/A 01/26/2022   Procedure: CESAREAN SECTION;  Surgeon: Lavina Hamman, MD;  Location: [facility];  Service: Obstetrics;  Laterality: N/A;   NO PAST SURGERIES     Social History   Tobacco Use   Smoking status: Never   Smokeless tobacco: Never  Vaping Use   Vaping status: Never Used  Substance Use Topics   Alcohol use: Yes    Comment: socially   Drug use: No   Family History  Problem Relation Age of Onset   Fibroids Other    Allergies  Allergen Reactions   Mushroom Extract Complex Hives      ROS Negative unless stated above    Objective:     BP 98/68 (BP Location: Right Arm, Patient Position: Sitting, Cuff Size: Normal)   Pulse 75   Temp 99 F (37.2 C) (Oral)   Ht 5' 4.5" (1.638 m)   Wt 220 lb 12.8 oz (100.2 kg)   LMP 09/19/2023   SpO2 98%   Breastfeeding No   BMI  37.31 kg/m  BP Readings from Last 3 Encounters:  10/11/23 98/68  07/11/23 100/68  07/04/22 104/60   Wt Readings from Last 3 Encounters:  10/11/23 220 lb 12.8 oz (100.2 kg)  07/11/23 221 lb 12.8 oz (100.6 kg)  03/24/22 201 lb (91.2 kg)      Physical Exam Constitutional:      General: She is not in acute distress.    Appearance: Normal appearance.  HENT:     Head: Normocephalic and atraumatic.     Nose: Nose normal.     Mouth/Throat:     Mouth: Mucous membranes are moist.  Cardiovascular:     Rate and Rhythm: Normal rate and regular rhythm.     Heart sounds: Normal heart sounds. No murmur heard.    No gallop.  Pulmonary:     Effort: Pulmonary effort is normal. No respiratory distress.     Breath sounds: Normal breath sounds. No wheezing, rhonchi or rales.  Skin:    General: Skin is warm and dry.  Neurological:     Mental Status: She is alert and oriented to person, place, and time.      No results found for any visits on 10/11/23.    Assessment & Plan:  Class 2 obesity with body mass index (  BMI) of 37.0 to 37.9 in adult, unspecified obesity type, unspecified whether serious comorbidity present -Body mass index is 37.31 kg/m. -Continue lifestyle modifications -Patient encouraged to keep a food diary -     Amb Referral to Nutrition and Diabetic Education  Vitamin D deficiency -Vitamin D level 21.72 on 07/11/2023 -Completed 12-week course of ergocalciferol -To start OTC vitamin D.  Recheck levels as needed. -Encouraged to get sunlight  Weight gain -20 pound weight gain since April 2023. -TSH, free T4, vitamin D, A1c from 07/11/2023 reviewed and normal. -Consider referral to weight management. -Will send referral to nutrition -     Amb Referral to Nutrition and Diabetic Education   Return in about 3 months (around 01/11/2024).   Deeann Saint, MD

## 2023-12-05 ENCOUNTER — Other Ambulatory Visit: Payer: Self-pay | Admitting: Medical Genetics

## 2023-12-31 ENCOUNTER — Telehealth: Payer: Self-pay

## 2023-12-31 ENCOUNTER — Ambulatory Visit: Payer: BC Managed Care – PPO | Admitting: Dietician

## 2023-12-31 NOTE — Telephone Encounter (Unsigned)
Copied from CRM [id number]. Topic: General - Other >> Dec 31, 2023  4:18 PM Leavy Cella D wrote: Reason for CRM: Vicky from [employer] Is calling to see if you have recived medical clearance for patient . Caller stated they need medical clearance back as soon as possible . Contact info  phone:[phone] / [phone]

## 2024-01-03 ENCOUNTER — Other Ambulatory Visit (HOSPITAL_COMMUNITY): Payer: Medicaid Other | Attending: Medical Genetics

## 2024-01-03 NOTE — Telephone Encounter (Signed)
Spoke with patient gave her the number to [employer] so she can send forms to [facility]

## 2024-01-17 DIAGNOSIS — M654 Radial styloid tenosynovitis [de Quervain]: Secondary | ICD-10-CM | POA: Diagnosis not present

## 2024-01-29 ENCOUNTER — Encounter: Payer: Self-pay | Admitting: Skilled Nursing Facility1

## 2024-01-29 ENCOUNTER — Encounter: Payer: Medicaid Other | Attending: Family Medicine | Admitting: Skilled Nursing Facility1

## 2024-01-29 VITALS — Ht 65.0 in | Wt 217.1 lb

## 2024-01-29 DIAGNOSIS — E669 Obesity, unspecified: Secondary | ICD-10-CM | POA: Diagnosis not present

## 2024-01-29 NOTE — Progress Notes (Unsigned)
 Medical Nutrition Therapy  Appointment Start time:  4:45  Appointment End time:  5:50  Primary concerns today: weight loss  Referral diagnosis: [id number]   NUTRITION ASSESSMENT    Clinical Medical Hx: N/A Medications: N/A Labs: A1C 5.7 Notable Signs/Symptoms: acid reflux, bowel movements 2 times a week  Lifestyle & Dietary Hx  Pt arrives with her 2 children.  Pt states she is under a lot of Stress currently. Pt states she is going to start going to the gym again. Pt states she likes to juice. 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   Body Composition Scale 01/29/2024  Current Body Weight 217.1  Total Body Fat % 39.9  Visceral Fat 10  Fat-Free Mass % 60   Total Body Water % 44.5  Muscle-Mass lbs 33.4  BMI 35.8  Body Fat Displacement          Torso  lbs 53.6         Left Leg  lbs 10.7         Right Leg  lbs 10.7         Left Arm  lbs 5.3         Right Arm   lbs 5.3     Estimated daily fluid intake:  oz Supplements: vitamin D, multivitamin  Sleep: about 4-6 hours  Stress / self-care: personal stress not discussed due to her children in appointment and work stress (adaptive class room teacher) Current average weekly physical activity: ADLs  24-Hr Dietary Recall First Meal: skipped or bagels Snack:  Second Meal: eaten out or school lunch  Snack:  Third Meal: skipped or eaten out  Snack:  Beverages: juiced, water    NUTRITION INTERVENTION  Nutrition education (E-1) on the following topics:  Creation of balanced and diverse meals to increase the intake of nutrient-rich foods that provide essential vitamins, minerals, fiber, and phytonutrients Variety of Fruits and Vegetables: Aim for a colorful array of fruits and vegetables to ensure a wide range of nutrients. Include a mix of leafy greens, berries, citrus fruits, cruciferous vegetables, and more. Whole Grains: Choose whole grains over refined grains. Examples include brown rice, quinoa,  oats, whole wheat, and barley. Lean Proteins: Include lean sources of protein, such as poultry, fish, tofu, legumes, beans, lentils, and low-fat dairy products. Limit red and processed meats. Healthy Fats: Incorporate sources of healthy fats, including avocados, nuts, seeds, and olive oil. Limit saturated and trans fats found in fried and processed foods. Dairy or Dairy Alternatives: Choose low-fat or fat-free dairy products, or plant-based alternatives like almond or soy milk. Portion Control: Be mindful of portion sizes to avoid overeating. Pay attention to hunger and satisfaction cues. Limit Added Sugars: Minimize the consumption of sugary beverages, snacks, and desserts. Check food labels for added sugars and opt for natural sources of sweetness such as whole fruits. Hydration: Drink plenty of water throughout the day. Limit sugary drinks and excessive caffeine intake. Moderate Sodium Intake: Reduce the consumption of high-sodium foods. Use herbs and spices for flavor instead of excessive salt. Meal Planning and Preparation: Plan and prepare meals ahead of time to make healthier choices more convenient. Include a mix of food groups in each meal. Limit Processed Foods: Minimize the intake of highly processed and packaged foods that are often high in added sugars, salt, and unhealthy fats. Regular Physical Activity: Combine a healthy diet with regular physical activity for overall well-being. Aim for at least 150 minutes  of moderate-intensity aerobic exercise per week, along with strength training. Moderation and Balance: Enjoy treats and indulgent foods in moderation, emphasizing balance rather than strict restriction.  Handouts Provided Include  Detailed MyPlate  Learning Style & Readiness for Change Teaching method utilized: Visual & Auditory  Demonstrated degree of understanding via: Teach Back  Barriers to learning/adherence to lifestyle change: multiple responsibilities     MONITORING & EVALUATION Dietary intake, weekly physical activity  Next Steps  Patient is to follow up in 2 months.

## 2024-04-11 ENCOUNTER — Telehealth (INDEPENDENT_AMBULATORY_CARE_PROVIDER_SITE_OTHER): Admitting: Family Medicine

## 2024-04-11 ENCOUNTER — Encounter: Payer: Self-pay | Admitting: Family Medicine

## 2024-04-11 DIAGNOSIS — T380X5A Adverse effect of glucocorticoids and synthetic analogues, initial encounter: Secondary | ICD-10-CM | POA: Diagnosis not present

## 2024-04-11 DIAGNOSIS — L819 Disorder of pigmentation, unspecified: Secondary | ICD-10-CM | POA: Diagnosis not present

## 2024-04-11 NOTE — Progress Notes (Signed)
 Virtual Visit via Video Note  I connected withNAME@ on 04/11/24 at  4:00 PM EDT by a video enabled telemedicine application and verified that I am speaking with the correct person using two identifiers.  Location patient: home Location provider:work or home office Persons participating in the virtual visit: patient, provider  I discussed the limitations of evaluation and management by telemedicine and the availability of in person appointments. The patient expressed understanding and agreed to proceed. Chief Complaint  Patient presents with   Skin Discoloration    White spots on left wrist,     HPI: Pt is a 33 yo female seen for acute concern.   Pt with several light areas on L lateral hand x 2-3 wks.  Pt has not changes in diet or meds.   Pt got a steroid injection in March in L wrist.  Had a steroid injection yrs ago with no issues.  No thinning of skin noted.  ROS: See pertinent positives and negatives per HPI.  Past Medical History:  Diagnosis Date   Anemia    Normal pregnancy, first 07/15/2013   SVD (spontaneous vaginal delivery) 07/16/2013    Past Surgical History:  Procedure Laterality Date   CESAREAN SECTION N/A 01/26/2022   Procedure: CESAREAN SECTION;  Surgeon: Cyd Dowse, MD;  Location: [facility];  Service: Obstetrics;  Laterality: N/A;   NO PAST SURGERIES      Family History  Problem Relation Age of Onset   Fibroids Other      Current Outpatient Medications:    acetaminophen  (TYLENOL ) 500 MG tablet, Take 500 mg by mouth every 6 (six) hours as needed (for pain.)., Disp: , Rfl:    AUVI-Q  0.3 MG/0.3ML SOAJ injection, Inject 0.3 mg into the muscle once as needed for anaphylaxis. As directed for life-threatening allergic reactions, Disp: 2 each, Rfl: 1   Azelastine -Fluticasone  (DYMISTA ) 137-50 MCG/ACT SUSP, Place 2 sprays into both nostrils 1 day or 1 dose., Disp: 23 g, Rfl: 5   cholecalciferol (VITAMIN D3) 25 MCG (1000 UNIT) tablet, Take 1,000 Units by mouth  daily., Disp: , Rfl:    ferrous gluconate  (FERGON) 324 MG tablet, Take 1 tablet (324 mg total) by mouth daily with breakfast., Disp: 90 tablet, Rfl: 1   ibuprofen  (ADVIL ) 600 MG tablet, Take 1 tablet (600 mg total) by mouth every 6 (six) hours as needed., Disp: 60 tablet, Rfl: 1   levonorgestrel (MIRENA, 52 MG,) 20 MCG/DAY IUD, 1 each by Intrauterine route once., Disp: , Rfl:   EXAM:  VITALS per patient if applicable:  GENERAL: alert, oriented, appears well and in no acute distress  HEENT: atraumatic, conjunctiva clear, no obvious abnormalities on inspection of external nose and ears  NECK: normal movements of the head and neck  LUNGS: on inspection no signs of respiratory distress, breathing rate appears normal, no obvious gross SOB, gasping or wheezing  CV: no obvious cyanosis  SKIN:  a large ovoid shaped area of hypopigmentation on L lateral wrist with 2 smaller areas of hypopigmentation on proximal lateral forearm.  MS: moves all visible extremities without noticeable abnormality  PSYCH/NEURO: pleasant and cooperative, no obvious depression or anxiety, speech and thought processing grossly intact  ASSESSMENT AND PLAN:  Discussed the following assessment and plan:  Hyperpigmentation of skin  Adverse effect of glucocorticoid or synthetic analogue, initial encounter  Steroid induced hypopigmentation of left wrist s/p cortisone injection.  Patient reassured.  Potential effects of steroid injections pt, skin thinning, etc.  Improvement with time.  Follow-up  as needed.   I discussed the assessment and treatment plan with the patient. The patient was provided an opportunity to ask questions and all were answered. The patient agreed with the plan and demonstrated an understanding of the instructions.   The patient was advised to call back or seek an in-person evaluation if the symptoms worsen or if the condition fails to improve as anticipated.   Viola Greulich, MD

## 2024-04-24 ENCOUNTER — Ambulatory Visit: Admitting: Family Medicine

## 2024-04-24 ENCOUNTER — Encounter: Payer: Self-pay | Admitting: Family Medicine

## 2024-04-24 VITALS — BP 110/72 | HR 80 | Temp 97.6°F | Ht 65.0 in | Wt 218.2 lb

## 2024-04-24 DIAGNOSIS — Z029 Encounter for administrative examinations, unspecified: Secondary | ICD-10-CM | POA: Diagnosis not present

## 2024-04-24 DIAGNOSIS — Z111 Encounter for screening for respiratory tuberculosis: Secondary | ICD-10-CM | POA: Diagnosis not present

## 2024-04-24 NOTE — Progress Notes (Addendum)
 Established Patient Office Visit   Subjective  Patient ID: Terry Kennedy, female    DOB: 12-Feb-1991  Age: 33 y.o. MRN: [id number]  Chief Complaint  Patient presents with   Medical Management of Chronic Issues    Forms and tb screening     Patient is a 33 year old female seen for administrative encounter.  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  TB testing also needed.    Patient Active Problem List   Diagnosis Date Noted   Indication for care in labor or delivery 01/25/2022   History of COVID-19 01/20/2020   Normal pregnancy, first 07/15/2013   Supervision of low-risk pregnancy 01/02/2013   Past Medical History:  Diagnosis Date   Anemia    Normal pregnancy, first 07/15/2013   SVD (spontaneous vaginal delivery) 07/16/2013   Past Surgical History:  Procedure Laterality Date   CESAREAN SECTION N/A 01/26/2022   Procedure: CESAREAN SECTION;  Surgeon: Cyd Dowse, MD;  Location: [facility];  Service: Obstetrics;  Laterality: N/A;   NO PAST SURGERIES     Social History   Tobacco Use   Smoking status: Never   Smokeless tobacco: Never  Vaping Use   Vaping status: Never Used  Substance Use Topics   Alcohol use: Yes    Comment: socially   Drug use: No   Family History  Problem Relation Age of Onset   Fibroids Other    Allergies  Allergen Reactions   Mushroom Extract Complex (Obsolete) Hives    ROS Negative unless stated above    Objective:      BP 110/72 (BP Location: Left Arm, Patient Position: Sitting, Cuff Size: Normal)   Pulse 80   Temp 97.6 F (36.4 C) (Oral)   Ht 5\' 5"  (1.651 m)   Wt 218 lb 3.2 oz (99 kg)   LMP 03/18/2024 (Exact Date)   BMI 36.31 kg/m  BP Readings from Last 3 Encounters:  04/24/24 110/72  10/11/23 98/68  07/11/23 100/68   Wt Readings from Last 3 Encounters:  04/24/24 218 lb 3.2 oz (99 kg)  01/29/24 217 lb 1.6 oz (98.5 kg)  10/11/23 220 lb 12.8 oz (100.2 kg)      Physical  Exam Constitutional:      General: She is not in acute distress.    Appearance: Normal appearance.  HENT:     Head: Normocephalic and atraumatic.     Nose: Nose normal.     Mouth/Throat:     Mouth: Mucous membranes are moist.  Cardiovascular:     Rate and Rhythm: Normal rate.     Heart sounds: No murmur heard.    No gallop.  Pulmonary:     Effort: Pulmonary effort is normal. No respiratory distress.     Breath sounds: No wheezing, rhonchi or rales.  Musculoskeletal:        General: Normal range of motion.  Skin:    General: Skin is warm and dry.  Neurological:     Mental Status: She is alert and oriented to person, place, and time.        04/11/2024    3:59 PM 10/11/2023    3:55 PM 07/11/2023    2:45 PM  Depression screen PHQ 2/9  Decreased Interest 3 1 2   Down, Depressed, Hopeless 2 1 1   PHQ - 2 Score 5 2 3   Altered sleeping 3 0 1  Tired, decreased energy 2 1 1   Change in appetite 0 0 1  Feeling bad or  failure about yourself  0 0 1  Trouble concentrating 1 1 1   Moving slowly or fidgety/restless 0 0 0  Suicidal thoughts 0 0 0  PHQ-9 Score 11 4 8   Difficult doing work/chores  Somewhat difficult Somewhat difficult      10/11/2023    3:56 PM 07/11/2023    2:45 PM  GAD 7 : Generalized Anxiety Score  Nervous, Anxious, on Edge 0 2  Control/stop worrying 0 2  Worry too much - different things 1 2  Trouble relaxing 1 2  Restless 0 0  Easily annoyed or irritable 1 1  Afraid - awful might happen 0 0  Total GAD 7 Score 3 9  Anxiety Difficulty Somewhat difficult Somewhat difficult     No results found for any visits on 04/24/24.    Assessment & Plan:   Administrative encounter  Visit for TB test -     QuantiFERON-TB Gold Plus   Forms completed with the exception of TB testing form.  Patient to have quantifying gold drawn today.  Will complete TB form when results available.  Patient to pick up form from clinic.  Return if symptoms worsen or fail to improve.    Viola Greulich, MD

## 2024-04-25 ENCOUNTER — Ambulatory Visit: Admitting: Family Medicine

## 2024-04-27 LAB — QUANTIFERON-TB GOLD PLUS
Mitogen-NIL: 6.63 [IU]/mL
NIL: 0.01 [IU]/mL
QuantiFERON-TB Gold Plus: NEGATIVE
TB1-NIL: 0 [IU]/mL
TB2-NIL: 0 [IU]/mL

## 2024-04-30 ENCOUNTER — Ambulatory Visit: Payer: Self-pay | Admitting: Family Medicine

## 2024-05-15 DIAGNOSIS — R5382 Chronic fatigue, unspecified: Secondary | ICD-10-CM | POA: Diagnosis not present

## 2024-05-15 DIAGNOSIS — Z833 Family history of diabetes mellitus: Secondary | ICD-10-CM | POA: Diagnosis not present

## 2024-05-15 DIAGNOSIS — E669 Obesity, unspecified: Secondary | ICD-10-CM | POA: Diagnosis not present

## 2024-05-15 DIAGNOSIS — Z130 Encounter for screening for diseases of the blood and blood-forming organs and certain disorders involving the immune mechanism: Secondary | ICD-10-CM | POA: Diagnosis not present

## 2024-05-15 DIAGNOSIS — Z13220 Encounter for screening for lipoid disorders: Secondary | ICD-10-CM | POA: Diagnosis not present

## 2024-05-15 DIAGNOSIS — Z13228 Encounter for screening for other metabolic disorders: Secondary | ICD-10-CM | POA: Diagnosis not present

## 2024-05-15 DIAGNOSIS — E782 Mixed hyperlipidemia: Secondary | ICD-10-CM | POA: Diagnosis not present

## 2024-05-15 DIAGNOSIS — N920 Excessive and frequent menstruation with regular cycle: Secondary | ICD-10-CM | POA: Diagnosis not present

## 2024-06-04 ENCOUNTER — Encounter (HOSPITAL_COMMUNITY): Payer: Self-pay | Admitting: Obstetrics and Gynecology

## 2024-06-04 NOTE — Progress Notes (Signed)
 Spoke w/ via phone for pre-op interview--- Terry Kennedy needs dos----  UPT and CBC per surgeon.        Kennedy results------ COVID test -----patient states asymptomatic no test needed Arrive at -------0930 NPO after MN NO Solid Food.  Clear liquids from MN until---0830 Pre-Surgery Ensure or G2:  Med rec completed Medications to take morning of surgery ----- NONE Diabetic medication -----  GLP1 agonist last dose: Wegovy 0.25mg /weekly. last dose 05/21/24. GLP1 instructions: Hold any further Wegovy doses until after procedure, patient verbalized instructions.  Patient instructed no nail polish to be worn day of surgery Patient instructed to bring photo id and insurance card day of surgery Patient aware to have Driver (ride ) / caregiver    for 24 hours after surgery - [name] Patient Special Instructions ----- Shower with antibacterial soap. Pre-Op special Instructions -----  Patient verbalized understanding of instructions that were given at this phone interview. Patient denies chest pain, sob, fever, cough at the interview.

## 2024-06-11 NOTE — H&P (Signed)
 Terry Kennedy is an 33 y.o. female. She was seen for annual exam in April, has Mirena , but menses getting heavier. On pelvic ultrasound in June, IUD is in her cervix, but also with 6.7 cm left adnexal cyst with papillary projections  Pertinent Gynecological History: Last pap: normal Date: 03/2024 OB History: G3, P2012 SVD x 1, LTCS x 1   Menstrual History: Patient's last menstrual period was 05/26/2024 (exact date).    Past Medical History:  Diagnosis Date   Anemia    Normal pregnancy, first 07/15/2013   Pre-diabetes    SVD (spontaneous vaginal delivery) 07/16/2013    Past Surgical History:  Procedure Laterality Date   CESAREAN SECTION N/A 01/26/2022   Procedure: CESAREAN SECTION;  Surgeon: Horacio Boas, MD;  Location: [facility];  Service: Obstetrics;  Laterality: N/A;   NO PAST SURGERIES      Family History  Problem Relation Age of Onset   Fibroids Other     Social History:  reports that she has never smoked. She has never used smokeless tobacco. She reports current alcohol use. She reports that she does not use drugs.  Allergies:  Allergies  Allergen Reactions   Mushroom Extract Complex (Obsolete) Hives    No medications prior to admission.    Review of Systems  Respiratory: Negative.    Cardiovascular: Negative.     Height 5' 5 (1.651 m), weight 93.4 kg, last menstrual period 05/26/2024. Physical Exam Constitutional:      Appearance: Normal appearance.  Cardiovascular:     Rate and Rhythm: Normal rate and regular rhythm.     Heart sounds: Normal heart sounds. No murmur heard. Pulmonary:     Effort: Pulmonary effort is normal. No respiratory distress.     Breath sounds: Normal breath sounds. No wheezing.  Abdominal:     General: There is no distension.     Palpations: Abdomen is soft. There is no mass.     Tenderness: There is no abdominal tenderness.  Genitourinary:    General: Normal vulva.     Comments: Uterus normal, IUD with long  strings No adnexal mass Musculoskeletal:     Cervical back: Normal range of motion and neck supple.  Neurological:     Mental Status: She is alert.     No results found for this or any previous visit (from the past 24 hours).  No results found.  Assessment/Plan: Left adnexal mass with papillary projections, Mirena  IUD in cervix. All medical and surgical options discussed, recommended removing left ovary/mass due to papillary projections, she also wants IUD replaced. Surgical procedure, risks, alternatives, chances of improving menses all discussed, questions answered. Will admit for laparoscopic removal of left adnexal mass, Mirena  replacement  Terry Kennedy 06/11/2024, 12:52 PM

## 2024-06-12 ENCOUNTER — Encounter (HOSPITAL_COMMUNITY)
Admission: RE | Disposition: A | Discharge status: Home / Self Care | Payer: Self-pay | Source: Home / Self Care | Attending: Obstetrics and Gynecology

## 2024-06-12 ENCOUNTER — Ambulatory Visit (HOSPITAL_COMMUNITY)
Admission: RE | Admit: 2024-06-12 | Discharge: 2024-06-12 | Disposition: A | Discharge status: Home / Self Care | Attending: Obstetrics and Gynecology | Admitting: Obstetrics and Gynecology

## 2024-06-12 ENCOUNTER — Other Ambulatory Visit: Payer: Self-pay

## 2024-06-12 ENCOUNTER — Encounter (HOSPITAL_COMMUNITY): Payer: Self-pay | Admitting: Obstetrics and Gynecology

## 2024-06-12 ENCOUNTER — Ambulatory Visit (HOSPITAL_BASED_OUTPATIENT_CLINIC_OR_DEPARTMENT_OTHER): Admitting: Anesthesiology

## 2024-06-12 ENCOUNTER — Ambulatory Visit (HOSPITAL_COMMUNITY): Admitting: Anesthesiology

## 2024-06-12 DIAGNOSIS — Z79899 Other long term (current) drug therapy: Secondary | ICD-10-CM | POA: Diagnosis not present

## 2024-06-12 DIAGNOSIS — D271 Benign neoplasm of left ovary: Secondary | ICD-10-CM | POA: Insufficient documentation

## 2024-06-12 DIAGNOSIS — Z30431 Encounter for routine checking of intrauterine contraceptive device: Secondary | ICD-10-CM | POA: Diagnosis not present

## 2024-06-12 DIAGNOSIS — N9489 Other specified conditions associated with female genital organs and menstrual cycle: Secondary | ICD-10-CM | POA: Diagnosis not present

## 2024-06-12 DIAGNOSIS — Z30430 Encounter for insertion of intrauterine contraceptive device: Secondary | ICD-10-CM | POA: Diagnosis not present

## 2024-06-12 DIAGNOSIS — N83202 Unspecified ovarian cyst, left side: Secondary | ICD-10-CM | POA: Diagnosis not present

## 2024-06-12 DIAGNOSIS — Z30432 Encounter for removal of intrauterine contraceptive device: Secondary | ICD-10-CM | POA: Diagnosis not present

## 2024-06-12 DIAGNOSIS — N839 Noninflammatory disorder of ovary, fallopian tube and broad ligament, unspecified: Secondary | ICD-10-CM | POA: Diagnosis not present

## 2024-06-12 HISTORY — PX: IUD REMOVAL: SHX5392

## 2024-06-12 HISTORY — DX: Prediabetes: R73.03

## 2024-06-12 HISTORY — PX: LAPAROSCOPIC OVARIAN CYSTECTOMY: SHX6248

## 2024-06-12 HISTORY — PX: INTRAUTERINE DEVICE (IUD) INSERTION: SHX5877

## 2024-06-12 LAB — CBC
HCT: 38.3 % (ref 36.0–46.0)
Hemoglobin: 12.7 g/dL (ref 12.0–15.0)
MCH: 29.4 pg (ref 26.0–34.0)
MCHC: 33.2 g/dL (ref 30.0–36.0)
MCV: 88.7 fL (ref 80.0–100.0)
Platelets: 252 K/uL (ref 150–400)
RBC: 4.32 MIL/uL (ref 3.87–5.11)
RDW: 12.5 % (ref 11.5–15.5)
WBC: 3.3 K/uL — ABNORMAL LOW (ref 4.0–10.5)
nRBC: 0 % (ref 0.0–0.2)

## 2024-06-12 LAB — POCT PREGNANCY, URINE: Preg Test, Ur: NEGATIVE

## 2024-06-12 SURGERY — EXCISION, CYST, OVARY, LAPAROSCOPIC
Anesthesia: General

## 2024-06-12 MED ORDER — CHLORHEXIDINE GLUCONATE 0.12 % MT SOLN
15.0000 mL | Freq: Once | OROMUCOSAL | Status: AC
  Administered 2024-06-12: 15 mL via OROMUCOSAL

## 2024-06-12 MED ORDER — HYDROMORPHONE HCL 1 MG/ML IJ SOLN: 0.2500 mg | INTRAMUSCULAR | Status: DC | PRN

## 2024-06-12 MED ORDER — DROPERIDOL 2.5 MG/ML IJ SOLN: 0.6250 mg | Freq: Once | INTRAMUSCULAR | Status: DC | PRN

## 2024-06-12 MED ORDER — OXYCODONE HCL 5 MG/5ML PO SOLN: 5.0000 mg | Freq: Once | ORAL | Status: AC | PRN

## 2024-06-12 MED ORDER — PROPOFOL 10 MG/ML IV BOLUS
INTRAVENOUS | Status: DC | PRN
  Administered 2024-06-12: 150 mg via INTRAVENOUS

## 2024-06-12 MED ORDER — ROCURONIUM BROMIDE 10 MG/ML (PF) SYRINGE
PREFILLED_SYRINGE | INTRAVENOUS | Status: AC
  Filled 2024-06-12: qty 30

## 2024-06-12 MED ORDER — OXYCODONE HCL 5 MG/5ML PO SOLN: 5.0000 mg | Freq: Once | ORAL | Status: DC | PRN

## 2024-06-12 MED ORDER — HYDROCODONE-ACETAMINOPHEN 5-325 MG PO TABS: 1.0000 | ORAL_TABLET | Freq: Four times a day (QID) | ORAL | 0 refills | Status: AC | PRN

## 2024-06-12 MED ORDER — BUPIVACAINE HCL (PF) 0.25 % IJ SOLN
INTRAMUSCULAR | Status: DC | PRN
  Administered 2024-06-12: 17 mL

## 2024-06-12 MED ORDER — LEVONORGESTREL 20 MCG/DAY IU IUD
INTRAUTERINE_SYSTEM | INTRAUTERINE | Status: AC
  Filled 2024-06-12: qty 1

## 2024-06-12 MED ORDER — BUPIVACAINE HCL (PF) 0.25 % IJ SOLN
INTRAMUSCULAR | Status: AC
  Filled 2024-06-12: qty 30

## 2024-06-12 MED ORDER — ACETAMINOPHEN 500 MG PO TABS
ORAL_TABLET | ORAL | Status: AC
  Filled 2024-06-12: qty 2

## 2024-06-12 MED ORDER — LIDOCAINE 2% (20 MG/ML) 5 ML SYRINGE
INTRAMUSCULAR | Status: AC
  Filled 2024-06-12: qty 15

## 2024-06-12 MED ORDER — PHENYLEPHRINE 80 MCG/ML (10ML) SYRINGE FOR IV PUSH (FOR BLOOD PRESSURE SUPPORT)
PREFILLED_SYRINGE | INTRAVENOUS | Status: DC | PRN
  Administered 2024-06-12: 160 ug via INTRAVENOUS
  Administered 2024-06-12: 80 ug via INTRAVENOUS

## 2024-06-12 MED ORDER — DEXAMETHASONE SODIUM PHOSPHATE 10 MG/ML IJ SOLN
INTRAMUSCULAR | Status: DC | PRN
  Administered 2024-06-12: 10 mg via INTRAVENOUS

## 2024-06-12 MED ORDER — PROPOFOL 10 MG/ML IV BOLUS
INTRAVENOUS | Status: AC
  Filled 2024-06-12: qty 20

## 2024-06-12 MED ORDER — SUGAMMADEX SODIUM 200 MG/2ML IV SOLN
INTRAVENOUS | Status: AC
Start: 2024-06-12 — End: 2024-06-12
  Filled 2024-06-12: qty 8

## 2024-06-12 MED ORDER — MIDAZOLAM HCL 2 MG/2ML IJ SOLN
INTRAMUSCULAR | Status: DC | PRN
  Administered 2024-06-12: 2 mg via INTRAVENOUS

## 2024-06-12 MED ORDER — GABAPENTIN 300 MG PO CAPS
300.0000 mg | ORAL_CAPSULE | ORAL | Status: AC
  Administered 2024-06-12: 300 mg via ORAL

## 2024-06-12 MED ORDER — ORAL CARE MOUTH RINSE: 15.0000 mL | Freq: Once | OROMUCOSAL | Status: AC

## 2024-06-12 MED ORDER — OXYCODONE HCL 5 MG PO TABS
ORAL_TABLET | ORAL | Status: AC
  Filled 2024-06-12: qty 1

## 2024-06-12 MED ORDER — SCOPOLAMINE 1 MG/3DAYS TD PT72
MEDICATED_PATCH | TRANSDERMAL | Status: AC
  Filled 2024-06-12: qty 1

## 2024-06-12 MED ORDER — IBUPROFEN 600 MG PO TABS: 600.0000 mg | ORAL_TABLET | Freq: Four times a day (QID) | ORAL | 0 refills | Status: AC | PRN

## 2024-06-12 MED ORDER — ACETAMINOPHEN 500 MG PO TABS
1000.0000 mg | ORAL_TABLET | ORAL | Status: AC
  Administered 2024-06-12: 1000 mg via ORAL

## 2024-06-12 MED ORDER — OXYCODONE HCL 5 MG PO TABS
5.0000 mg | ORAL_TABLET | Freq: Once | ORAL | Status: AC | PRN
  Administered 2024-06-12: 5 mg via ORAL

## 2024-06-12 MED ORDER — ONDANSETRON HCL 4 MG/2ML IJ SOLN
INTRAMUSCULAR | Status: AC
Start: 2024-06-12 — End: 2024-06-12
  Filled 2024-06-12: qty 6

## 2024-06-12 MED ORDER — OXYCODONE HCL 5 MG PO TABS: 5.0000 mg | ORAL_TABLET | Freq: Once | ORAL | Status: DC | PRN

## 2024-06-12 MED ORDER — LEVONORGESTREL 20 MCG/DAY IU IUD
1.0000 | INTRAUTERINE_SYSTEM | INTRAUTERINE | Status: AC
  Administered 2024-06-12: 1 via INTRAUTERINE

## 2024-06-12 MED ORDER — SODIUM CHLORIDE 0.9 % IR SOLN
Status: DC | PRN
  Administered 2024-06-12: 1000 mL

## 2024-06-12 MED ORDER — LACTATED RINGERS IV SOLN
INTRAVENOUS | Status: DC
  Administered 2024-06-12: 1000 mL via INTRAVENOUS

## 2024-06-12 MED ORDER — SCOPOLAMINE 1 MG/3DAYS TD PT72
1.0000 | MEDICATED_PATCH | TRANSDERMAL | Status: DC
  Administered 2024-06-12: 1.5 mg via TRANSDERMAL

## 2024-06-12 MED ORDER — CHLORHEXIDINE GLUCONATE 0.12 % MT SOLN
OROMUCOSAL | Status: AC
  Filled 2024-06-12: qty 15

## 2024-06-12 MED ORDER — DEXAMETHASONE SODIUM PHOSPHATE 10 MG/ML IJ SOLN
INTRAMUSCULAR | Status: AC
Start: 2024-06-12 — End: 2024-06-12
  Filled 2024-06-12: qty 3

## 2024-06-12 MED ORDER — LACTATED RINGERS IV SOLN: INTRAVENOUS | Status: DC | PRN

## 2024-06-12 MED ORDER — FENTANYL CITRATE (PF) 250 MCG/5ML IJ SOLN
INTRAMUSCULAR | Status: AC
Start: 2024-06-12 — End: 2024-06-12
  Filled 2024-06-12: qty 5

## 2024-06-12 MED ORDER — GABAPENTIN 300 MG PO CAPS
ORAL_CAPSULE | ORAL | Status: AC
  Filled 2024-06-12: qty 1

## 2024-06-12 MED ORDER — FENTANYL CITRATE (PF) 250 MCG/5ML IJ SOLN
INTRAMUSCULAR | Status: DC | PRN
  Administered 2024-06-12: 50 ug via INTRAVENOUS
  Administered 2024-06-12: 100 ug via INTRAVENOUS
  Administered 2024-06-12: 50 ug via INTRAVENOUS

## 2024-06-12 MED ORDER — SUGAMMADEX SODIUM 200 MG/2ML IV SOLN
INTRAVENOUS | Status: DC | PRN
  Administered 2024-06-12: 400 mg via INTRAVENOUS

## 2024-06-12 MED ORDER — LIDOCAINE 2% (20 MG/ML) 5 ML SYRINGE
INTRAMUSCULAR | Status: DC | PRN
  Administered 2024-06-12: 60 mg via INTRAVENOUS

## 2024-06-12 MED ORDER — ROCURONIUM BROMIDE 10 MG/ML (PF) SYRINGE
PREFILLED_SYRINGE | INTRAVENOUS | Status: DC | PRN
  Administered 2024-06-12: 50 mg via INTRAVENOUS

## 2024-06-12 MED ORDER — ONDANSETRON HCL 4 MG/2ML IJ SOLN
INTRAMUSCULAR | Status: DC | PRN
  Administered 2024-06-12: 4 mg via INTRAVENOUS

## 2024-06-12 MED ORDER — LACTATED RINGERS IV SOLN: INTRAVENOUS | Status: DC

## 2024-06-12 MED ORDER — MIDAZOLAM HCL 2 MG/2ML IJ SOLN
INTRAMUSCULAR | Status: AC
  Filled 2024-06-12: qty 2

## 2024-06-12 SURGICAL SUPPLY — 32 items
CATH ROBINSON RED A/P 16FR (CATHETERS) IMPLANT
DERMABOND ADVANCED .7 DNX12 (GAUZE/BANDAGES/DRESSINGS) ×2 IMPLANT
DRAPE SURG IRRIG POUCH 19X23 (DRAPES) ×2 IMPLANT
DRSG OPSITE POSTOP 3X4 (GAUZE/BANDAGES/DRESSINGS) IMPLANT
DURAPREP 26ML APPLICATOR (WOUND CARE) ×2 IMPLANT
GLOVE BIOGEL PI IND STRL 7.0 (GLOVE) ×4 IMPLANT
GLOVE ORTHO TXT STRL SZ7.5 (GLOVE) ×2 IMPLANT
GOWN STRL REUS W/ TWL XL LVL3 (GOWN DISPOSABLE) ×2 IMPLANT
IRRIGATION SUCT STRKRFLW 2 WTP (MISCELLANEOUS) IMPLANT
KIT NDL GUIDE 18G (SET/KITS/TRAYS/PACK) IMPLANT
KIT NEEDLE GUIDE 18G (SET/KITS/TRAYS/PACK) ×2 IMPLANT
KIT PINK PAD W/HEAD ARM REST (MISCELLANEOUS) ×2 IMPLANT
KIT TURNOVER KIT B (KITS) ×2 IMPLANT
NDL INSUFFLATION 14GA 120MM (NEEDLE) ×2 IMPLANT
NEEDLE INSUFFLATION 14GA 120MM (NEEDLE) ×2 IMPLANT
NS IRRIG 1000ML POUR BTL (IV SOLUTION) ×2 IMPLANT
PACK LAPAROSCOPY BASIN (CUSTOM PROCEDURE TRAY) ×2 IMPLANT
SCISSORS LAP 5X45 EPIX DISP (ENDOMECHANICALS) IMPLANT
SET TUBE SMOKE EVAC HIGH FLOW (TUBING) ×2 IMPLANT
SHEARS HARMONIC ACE PLUS 36CM (ENDOMECHANICALS) IMPLANT
SLEEVE Z-THREAD 5X100MM (TROCAR) IMPLANT
SOLUTION ELECTROSURG ANTI STCK (MISCELLANEOUS) IMPLANT
SPIKE FLUID TRANSFER (MISCELLANEOUS) ×2 IMPLANT
SUT VIC AB 4-0 PS2 18 (SUTURE) ×2 IMPLANT
SUT VICRYL 0 UR6 27IN ABS (SUTURE) ×2 IMPLANT
SYR 50ML LL SCALE MARK (SYRINGE) IMPLANT
SYSTEM BAG RETRIEVAL 10MM (BASKET) IMPLANT
TOWEL GREEN STERILE FF (TOWEL DISPOSABLE) ×2 IMPLANT
TRAY FOLEY W/BAG SLVR 14FR (SET/KITS/TRAYS/PACK) IMPLANT
TROCAR 11X100 Z THREAD (TROCAR) ×2 IMPLANT
TROCAR ADV FIXATION 5X100MM (TROCAR) IMPLANT
WARMER LAPAROSCOPE (MISCELLANEOUS) ×2 IMPLANT

## 2024-06-12 NOTE — Transfer of Care (Signed)
 Immediate Anesthesia Transfer of Care Note  Patient: Terry Kennedy  Procedure(s) Performed: LAPAROSCOPIC LEFT SALPINGO-OOPHERECTOMY (Left) REMOVAL, INTRAUTERINE DEVICE INSERTION, INTRAUTERINE DEVICE  Patient Location: PACU  Anesthesia Type:General  Level of Consciousness: awake, alert , and oriented  Airway & Oxygen Therapy: Patient Spontanous Breathing and Patient connected to nasal cannula oxygen  Post-op Assessment: Report given to RN and Post -op Vital signs reviewed and stable  Post vital signs: Reviewed and stable  Last Vitals:  Vitals Value Taken Time  BP 131/81 06/12/24 13:07  Temp 97.9 1307  Pulse 79 06/12/24 13:07  Resp 15 06/12/24 13:10  SpO2 100 % 06/12/24 13:07  Vitals shown include unfiled device data.  Last Pain:  Vitals:   06/12/24 1031  TempSrc: Oral  PainSc: 0-No pain      Patients Stated Pain Goal: 5 (06/12/24 1031)  Complications: No notable events documented.

## 2024-06-12 NOTE — Anesthesia Procedure Notes (Signed)
 Procedure Name: Intubation Date/Time: 06/12/2024 11:50 AM  Performed by: Scherrie Mast, CRNAPre-anesthesia Checklist: Patient identified, Emergency Drugs available, Suction available and Patient being monitored Patient Re-evaluated:Patient Re-evaluated prior to induction Oxygen Delivery Method: Circle System Utilized Preoxygenation: Pre-oxygenation with 100% oxygen Induction Type: IV induction Ventilation: Mask ventilation without difficulty Laryngoscope Size: Mac and 3 Grade View: Grade I Tube type: Oral Tube size: 7.0 mm Number of attempts: 1 Airway Equipment and Method: Stylet and Oral airway Placement Confirmation: ETT inserted through vocal cords under direct vision, positive ETCO2 and breath sounds checked- equal and bilateral Secured at: 21 cm Tube secured with: Tape Dental Injury: Teeth and Oropharynx as per pre-operative assessment

## 2024-06-12 NOTE — Interval H&P Note (Signed)
 History and Physical Interval Note:  06/12/2024 11:00 AM  Terry Kennedy  has presented today for surgery, with the diagnosis of left adnexal mass and IUD misplaced.  The various methods of treatment have been discussed with the patient and family. After consideration of risks, benefits and other options for treatment, the patient has consented to  Procedure(s): EXCISION, CYST, OVARY, LAPAROSCOPIC (Left) REMOVAL, INTRAUTERINE DEVICE (N/A) INSERTION, INTRAUTERINE DEVICE (N/A) as a surgical intervention.  The patient's history has been reviewed, patient examined, no change in status, stable for surgery.  I have reviewed the patient's chart and labs.  Questions were answered to the patient's satisfaction.     Terry Kennedy

## 2024-06-12 NOTE — Op Note (Signed)
 Obstetrics Op Note 05/14/2015  8:27 AM    Expand All Collapse All   Preoperative diagnosis: Left ovarian mass Postoperative diagnosis: Same Procedure: Open laparoscopy with left salpingo-oophorectomy, IUD removal, insertion of Mirena  IUD Surgeon: Krystal Deaner M.D. Anesthesia: Gen. Endotracheal tube Findings: She had a normal uterus, right tube and ovary. Left ovary was enlarged and cystic to about 8 cm. The remainder of her abdomen appeared normal. Estimated blood loss: Minimal Specimens: Left tube and ovary Complications: None  Procedure in detail  The patient was taken to the operating room and placed in the dorsal supine position. General anesthesia was induced. Both arms were tucked to her side and legs were placed in the mobile stirrups. Abdomen perineum and vagina were then prepped and draped in the usual sterile fashion, bladder drained with a Foley catheter, Hulka tenaculum applied to the cervix for uterine manipulation. Infraumbilical skin was then infiltrated with quarter percent Marcaine  and a 3 cm horizontal incision was made. Fascia was identified and elevated and entered sharply. Peritoneum was then also elevated and entered sharply. The incision was extended bilaterally. A pursestring suture of 0 Vicryl was placed around the fascia and the Hassan cannula was inserted and secured. Abdomen was insufflated with CO2 and good visualization was achieved. 5 mm ports were placed on each side under direct visualization. Inspection revealed the above-mentioned findings.  The large cyst on the left ovary, I was unable to determine if it was part of the tube or ovary.  I was able to grasp the ovary and lifted anteriorly as well as the tube and come across the fallopian tube infundibulopelvic and utero-ovarian ligaments to free the left tube and ovary.  The pedicles were hemostatic. The 5 mm scope was then placed through the port on the left and an Endo Catch bag was placed through the umbilical  port. The tube and ovary were scooped into the bag and brought to the umbilical incision.  Part of the tube and ovary were hanging out of the bag that were removed sharply at the umbilical incision.  The cyst was drained of 50 cc of clear yellow fluid while it was in the bag and the remaining ovary and cyst were able to be removed in the bag.  The previously placed pursestring suture was then tied.  A small gap and fascia was closed with a figure-of-eight suture of 0 Vicryl.  The abdomen was reinsufflated and pedicles were found to be hemostatic.  All gas was allowed to deflate from the abdomen.  5 mm ports were removed. All skin incisions were closed with subcutaneous sutures of 4-0 Vicryl followed by Dermabond.  Attention was then turned vaginally.  The Hulka tenaculum was removed.  Her current Mirena  IUD was removed easily and was intact.  A new Mirena  was then placed after the uterus sounded to 7 cm.  The strings were trimmed to 2 to 3 cm.  The cervix had been grasped with a single-tooth tenaculum for countertraction and it was removed and puncture sites were hemostatic.  The patient was awakened in the operating room and taken to the recovery room in stable condition after tolerating the procedure well. Counts were correct and she had PAS hose on throughout the procedure.

## 2024-06-12 NOTE — Anesthesia Preprocedure Evaluation (Addendum)
 Anesthesia Evaluation  Patient identified by MRN, date of birth, ID band Patient awake    Reviewed: Allergy  & Precautions, NPO status , Patient's Chart, lab work & pertinent test results  Airway Mallampati: II  TM Distance: >3 FB Neck ROM: Full    Dental no notable dental hx.    Pulmonary neg pulmonary ROS   Pulmonary exam normal        Cardiovascular negative cardio ROS  Rhythm:Regular Rate:Normal     Neuro/Psych negative neurological ROS  negative psych ROS   GI/Hepatic negative GI ROS, Neg liver ROS,,,  Endo/Other  negative endocrine ROS    Renal/GU negative Renal ROS  Female GU complaint     Musculoskeletal negative musculoskeletal ROS (+)    Abdominal Normal abdominal exam  (+)   Peds  Hematology  (+) Blood dyscrasia, anemia   Anesthesia Other Findings   Reproductive/Obstetrics                              Anesthesia Physical Anesthesia Plan  ASA: 2  Anesthesia Plan: General   Post-op Pain Management: Tylenol  PO (pre-op)* and Gabapentin  PO (pre-op)*   Induction: Intravenous  PONV Risk Score and Plan: 3 and Ondansetron , Dexamethasone , Midazolam  and Treatment may vary due to age or medical condition  Airway Management Planned: Mask and Oral ETT  Additional Equipment: None  Intra-op Plan:   Post-operative Plan: Extubation in OR  Informed Consent: I have reviewed the patients History and Physical, chart, labs and discussed the procedure including the risks, benefits and alternatives for the proposed anesthesia with the patient or authorized representative who has indicated his/her understanding and acceptance.     Dental advisory given  Plan Discussed with: CRNA  Anesthesia Plan Comments:         Anesthesia Quick Evaluation

## 2024-06-12 NOTE — Anesthesia Postprocedure Evaluation (Signed)
 Anesthesia Post Note  Patient: Arvid Ferdie Piety  Procedure(s) Performed: LAPAROSCOPIC LEFT SALPINGO-OOPHERECTOMY (Left) REMOVAL, INTRAUTERINE DEVICE INSERTION, INTRAUTERINE DEVICE     Patient location during evaluation: PACU Anesthesia Type: General Level of consciousness: awake and alert Pain management: pain level controlled Vital Signs Assessment: post-procedure vital signs reviewed and stable Respiratory status: spontaneous breathing, nonlabored ventilation, respiratory function stable and patient connected to nasal cannula oxygen Cardiovascular status: blood pressure returned to baseline and stable Postop Assessment: no apparent nausea or vomiting Anesthetic complications: no   There were no known notable events for this encounter.  Last Vitals:  Vitals:   06/12/24 1345 06/12/24 1400  BP: 108/78   Pulse: 71 73  Resp: 15 14  Temp:    SpO2: 100% 97%    Last Pain:  Vitals:   06/12/24 1357  TempSrc:   PainSc: 4                  Latandra Loureiro P Gurney Balthazor

## 2024-06-12 NOTE — Discharge Instructions (Addendum)
Routine instructions for laparoscopy   Post Anesthesia Home Care Instructions  Activity: Get plenty of rest for the remainder of the day. A responsible individual must stay with you for 24 hours following the procedure.  For the next 24 hours, DO NOT: -Drive a car -Advertising copywriter -Drink alcoholic beverages -Take any medication unless instructed by your physician -Make any legal decisions or sign important papers.  Meals: Start with liquid foods such as gelatin or soup. Progress to regular foods as tolerated. Avoid greasy, spicy, heavy foods. If nausea and/or vomiting occur, drink only clear liquids until the nausea and/or vomiting subsides. Call your physician if vomiting continues.  Special Instructions/Symptoms: Your throat may feel dry or sore from the anesthesia or the breathing tube placed in your throat during surgery. If this causes discomfort, gargle with warm salt water. The discomfort should disappear within 24 hours.  If you had a scopolamine patch placed behind your ear for the management of post- operative nausea and/or vomiting:  1. The medication in the patch is effective for 72 hours, after which it should be removed.  Wrap patch in a tissue and discard in the trash. Wash hands thoroughly with soap and water. 2. You may remove the patch earlier than 72 hours if you experience unpleasant side effects which may include dry mouth, dizziness or visual disturbances. 3. Avoid touching the patch. Wash your hands with soap and water after contact with the patch.

## 2024-06-13 ENCOUNTER — Encounter (HOSPITAL_COMMUNITY): Payer: Self-pay | Admitting: Obstetrics and Gynecology

## 2024-06-13 LAB — SURGICAL PATHOLOGY

## 2024-06-15 LAB — CYTOLOGY - NON PAP

## 2024-09-12 ENCOUNTER — Encounter: Payer: Self-pay | Admitting: *Deleted

## 2024-09-12 ENCOUNTER — Other Ambulatory Visit: Payer: Self-pay | Admitting: Medical Genetics

## 2024-09-12 DIAGNOSIS — Z006 Encounter for examination for normal comparison and control in clinical research program: Secondary | ICD-10-CM

## 2024-09-12 NOTE — Addendum Note (Signed)
 Addended by: ANITRA BLACKBIRD E on: 09/12/2024 10:44 AM   Modules accepted: Orders

## 2024-10-09 DIAGNOSIS — E669 Obesity, unspecified: Secondary | ICD-10-CM | POA: Diagnosis not present

## 2024-10-09 DIAGNOSIS — R5382 Chronic fatigue, unspecified: Secondary | ICD-10-CM | POA: Diagnosis not present
